# Patient Record
Sex: Male | Born: 1955 | Race: White | Hispanic: No | Marital: Married | Smoking: Former smoker
Health system: Southern US, Community
[De-identification: ages and names within clinical notes are randomized; demographics above are authoritative.]

## PROBLEM LIST (undated history)

## (undated) DIAGNOSIS — K219 Gastro-esophageal reflux disease without esophagitis: Secondary | ICD-10-CM

## (undated) DIAGNOSIS — I10 Essential (primary) hypertension: Secondary | ICD-10-CM

## (undated) DIAGNOSIS — G4700 Insomnia, unspecified: Secondary | ICD-10-CM

## (undated) HISTORY — DX: Insomnia, unspecified: G47.00

## (undated) HISTORY — DX: Gastro-esophageal reflux disease without esophagitis: K21.9

## (undated) HISTORY — DX: Essential (primary) hypertension: I10

---

## 2009-08-14 HISTORY — PX: ACHILLES TENDON REPAIR: SUR1153

## 2013-07-17 ENCOUNTER — Encounter (INDEPENDENT_AMBULATORY_CARE_PROVIDER_SITE_OTHER): Payer: Self-pay

## 2013-07-17 ENCOUNTER — Encounter: Payer: Self-pay | Admitting: Family Medicine

## 2013-07-17 ENCOUNTER — Ambulatory Visit (INDEPENDENT_AMBULATORY_CARE_PROVIDER_SITE_OTHER): Admitting: Family Medicine

## 2013-07-17 VITALS — BP 117/73 | HR 75 | Temp 99.2°F | Ht 66.0 in | Wt 205.0 lb

## 2013-07-17 DIAGNOSIS — N451 Epididymitis: Secondary | ICD-10-CM

## 2013-07-17 DIAGNOSIS — N453 Epididymo-orchitis: Secondary | ICD-10-CM

## 2013-07-17 DIAGNOSIS — N390 Urinary tract infection, site not specified: Secondary | ICD-10-CM

## 2013-07-17 DIAGNOSIS — R319 Hematuria, unspecified: Secondary | ICD-10-CM

## 2013-07-17 LAB — POCT UA - MICROSCOPIC ONLY
Casts, Ur, LPF, POC: NEGATIVE
Crystals, Ur, HPF, POC: NEGATIVE
Mucus, UA: NEGATIVE
Yeast, UA: NEGATIVE

## 2013-07-17 LAB — POCT URINALYSIS DIPSTICK

## 2013-07-17 MED ORDER — CIPROFLOXACIN HCL 500 MG PO TABS: 500.0000 mg | ORAL_TABLET | Freq: Two times a day (BID) | ORAL | Status: DC

## 2013-07-17 NOTE — Progress Notes (Signed)
   Subjective:    Patient ID: Devin Santiago, male    DOB: 08-21-1955, 57 y.o.   MRN: [id number]  HPI This 57 y.o. male presents for evaluation of dysuria, hematuria, and left testicular swelling And discomfort.  He has been having some urinary problems for over a few weeks now..   Review of Systems    No chest pain, SOB, HA, dizziness, vision change, N/V, diarrhea, constipation, dysuria, urinary urgency or frequency, myalgias, arthralgias or rash.  Objective:   Physical Exam Vital signs noted  Well developed well nourished male.  HEENT - Head atraumatic Normocephalic Respiratory - Lungs CTA bilateral Cardiac - RRR S1 and S2 without murmur GI - Abdomen soft Nontender and bowel sounds active x 4 GU - Swollen and tender left testicle and left inguinal lymph nodes.       Assessment & Plan:  Hematuria - Plan: POCT UA - Microscopic Only, POCT urinalysis dipstick, Urine culture  Epididymitis - Plan: Urine culture, cipro 500mg  one po bid x 3 weeks.  UTI (lower urinary tract infection) - Plan: Urine culture Cipro 500mg  one po bid x 3 weeks.  Recommend repeat UA one week after finishing abx's.  Push po fluids, rest, tylenol and motrin otc prn as directed for fever, arthralgias, and myalgias.  Follow up prn if sx's continue or persist.  Deatra Canter FNP

## 2013-07-17 NOTE — Patient Instructions (Signed)
Epididymitis  Epididymitis is a swelling (inflammation) of the epididymis. The epididymis is a cord-like structure along the back part of the testicle. Epididymitis is usually, but not always, caused by infection. This is usually a sudden problem beginning with chills, fever and pain behind the scrotum and in the testicle. There may be swelling and redness of the testicle.  DIAGNOSIS   Physical examination will reveal a tender, swollen epididymis. Sometimes, cultures are obtained from the urine or from prostate secretions to help find out if there is an infection or if the cause is a different problem. Sometimes, blood work is performed to see if your white blood cell count is elevated and if a germ (bacterial) or viral infection is present. Using this knowledge, an appropriate medicine which kills germs (antibiotic) can be chosen by your caregiver. A viral infection causing epididymitis will most often go away (resolve) without treatment.  HOME CARE INSTRUCTIONS   · Hot sitz baths for 20 minutes, 4 times per day, may help relieve pain.  · Only take over-the-counter or prescription medicines for pain, discomfort or fever as directed by your caregiver.  · Take all medicines, including antibiotics, as directed. Take the antibiotics for the full prescribed length of time even if you are feeling better.  · It is very important to keep all follow-up appointments.  SEEK IMMEDIATE MEDICAL CARE IF:   · You have a fever.  · You have pain not relieved with medicines.  · You have any worsening of your problems.  · Your pain seems to come and go.  · You develop pain, redness, and swelling in the scrotum and surrounding areas.  MAKE SURE YOU:   · Understand these instructions.  · Will watch your condition.  · Will get help right away if you are not doing well or get worse.  Document Released: 07/28/2000 Document Revised: 10/23/2011 Document Reviewed: 06/17/2009  ExitCare® Patient Information ©2014 [employer].

## 2013-07-19 LAB — URINE CULTURE

## 2013-08-12 ENCOUNTER — Ambulatory Visit (INDEPENDENT_AMBULATORY_CARE_PROVIDER_SITE_OTHER): Admitting: Family Medicine

## 2013-08-12 ENCOUNTER — Encounter: Payer: Self-pay | Admitting: Family Medicine

## 2013-08-12 VITALS — BP 145/86 | HR 68 | Temp 97.5°F | Ht 66.0 in | Wt 205.0 lb

## 2013-08-12 DIAGNOSIS — I10 Essential (primary) hypertension: Secondary | ICD-10-CM

## 2013-08-12 DIAGNOSIS — G4700 Insomnia, unspecified: Secondary | ICD-10-CM

## 2013-08-12 DIAGNOSIS — R319 Hematuria, unspecified: Secondary | ICD-10-CM

## 2013-08-12 DIAGNOSIS — N529 Male erectile dysfunction, unspecified: Secondary | ICD-10-CM

## 2013-08-12 DIAGNOSIS — K219 Gastro-esophageal reflux disease without esophagitis: Secondary | ICD-10-CM

## 2013-08-12 DIAGNOSIS — E291 Testicular hypofunction: Secondary | ICD-10-CM

## 2013-08-12 LAB — POCT URINALYSIS DIPSTICK
Bilirubin, UA: NEGATIVE
Blood, UA: NEGATIVE
Glucose, UA: NEGATIVE
Ketones, UA: NEGATIVE
Leukocytes, UA: NEGATIVE
Nitrite, UA: NEGATIVE
Protein, UA: NEGATIVE
Spec Grav, UA: 1.015
Urobilinogen, UA: NEGATIVE
pH, UA: 6.5

## 2013-08-12 LAB — POCT UA - MICROSCOPIC ONLY
Bacteria, U Microscopic: NEGATIVE
Casts, Ur, LPF, POC: NEGATIVE
Crystals, Ur, HPF, POC: NEGATIVE
Mucus, UA: NEGATIVE
RBC, urine, microscopic: NEGATIVE
WBC, Ur, HPF, POC: NEGATIVE
Yeast, UA: NEGATIVE

## 2013-08-12 MED ORDER — SILDENAFIL CITRATE 100 MG PO TABS: 50.0000 mg | ORAL_TABLET | Freq: Every day | ORAL | Status: DC | PRN

## 2013-08-12 MED ORDER — OMEPRAZOLE 40 MG PO CPDR: 40.0000 mg | DELAYED_RELEASE_CAPSULE | Freq: Every day | ORAL | Status: DC

## 2013-08-12 MED ORDER — LISINOPRIL 20 MG PO TABS: 20.0000 mg | ORAL_TABLET | Freq: Every day | ORAL | Status: DC

## 2013-08-12 MED ORDER — TRAZODONE HCL 50 MG PO TABS: 25.0000 mg | ORAL_TABLET | Freq: Every evening | ORAL | Status: DC | PRN

## 2013-08-12 MED ORDER — TESTOSTERONE 50 MG/5GM (1%) TD GEL: TRANSDERMAL | Status: DC

## 2013-08-12 NOTE — Patient Instructions (Signed)
Urinary Tract Infection  Urinary tract infections (UTIs) can develop anywhere along your urinary tract. Your urinary tract is your body's drainage system for removing wastes and extra water. Your urinary tract includes two kidneys, two ureters, a bladder, and a urethra. Your kidneys are a pair of bean-shaped organs. Each kidney is about the size of your fist. They are located below your ribs, one on each side of your spine.  CAUSES  Infections are caused by microbes, which are microscopic organisms, including fungi, viruses, and bacteria. These organisms are so small that they can only be seen through a microscope. Bacteria are the microbes that most commonly cause UTIs.  SYMPTOMS   Symptoms of UTIs may vary by age and gender of the patient and by the location of the infection. Symptoms in young women typically include a frequent and intense urge to urinate and a painful, burning feeling in the bladder or urethra during urination. Older women and men are more likely to be tired, shaky, and weak and have muscle aches and abdominal pain. A fever may mean the infection is in your kidneys. Other symptoms of a kidney infection include pain in your back or sides below the ribs, nausea, and vomiting.  DIAGNOSIS  To diagnose a UTI, your caregiver will ask you about your symptoms. Your caregiver also will ask to provide a urine sample. The urine sample will be tested for bacteria and white blood cells. White blood cells are made by your body to help fight infection.  TREATMENT   Typically, UTIs can be treated with medication. Because most UTIs are caused by a bacterial infection, they usually can be treated with the use of antibiotics. The choice of antibiotic and length of treatment depend on your symptoms and the type of bacteria causing your infection.  HOME CARE INSTRUCTIONS   If you were prescribed antibiotics, take them exactly as your caregiver instructs you. Finish the medication even if you feel better after you  have only taken some of the medication.   Drink enough water and fluids to keep your urine clear or pale yellow.   Avoid caffeine, tea, and carbonated beverages. They tend to irritate your bladder.   Empty your bladder often. Avoid holding urine for long periods of time.   Empty your bladder before and after sexual intercourse.   After a bowel movement, women should cleanse from front to back. Use each tissue only once.  SEEK MEDICAL CARE IF:    You have back pain.   You develop a fever.   Your symptoms do not begin to resolve within 3 days.  SEEK IMMEDIATE MEDICAL CARE IF:    You have severe back pain or lower abdominal pain.   You develop chills.   You have nausea or vomiting.   You have continued burning or discomfort with urination.  MAKE SURE YOU:    Understand these instructions.   Will watch your condition.   Will get help right away if you are not doing well or get worse.  Document Released: 05/10/2005 Document Revised: 01/30/2012 Document Reviewed: 09/08/2011  ExitCare Patient Information 2014 [employer].

## 2013-08-12 NOTE — Progress Notes (Signed)
   Subjective:    Patient ID: Devin Santiago, male    DOB: 07-01-1956, 57 y.o.   MRN: [id number]  HPI This 57 y.o. male presents for evaluation of follow up on left epididymitis and was tx'd with cipro and he Is feeling better.  He is no longer having any testicular discomfort.  He has hx of GERD, hypogonadism, Hypertension, and insomnia.  He needs refills.  He is seen at the [place] and gets labs done there and he Will bring labs to office from [place].   Review of Systems No chest pain, SOB, HA, dizziness, vision change, N/V, diarrhea, constipation, dysuria, urinary urgency or frequency, myalgias, arthralgias or rash.     Objective:   Physical Exam  Vital signs noted  Well developed well nourished male.  HEENT - Head atraumatic Normocephalic                Eyes - PERRLA, Conjuctiva - clear Sclera- Clear EOMI                Ears - EAC's Wnl TM's Wnl Gross Hearing WNL                Nose - Nares patent                 Throat - oropharanx wnl Respiratory - Lungs CTA bilateral Cardiac - RRR S1 and S2 without murmur GI - Abdomen soft Nontender and bowel sounds active x 4 GU- Uncircumcised penis, testicles w/o masses and nontender, no inguinal hernia Extremities - No edema. Neuro - Grossly intact.  Results for orders placed in visit on 08/12/13  POCT UA - MICROSCOPIC ONLY      Result Value Range   WBC, Ur, HPF, POC neg     RBC, urine, microscopic neg     Bacteria, U Microscopic neg     Mucus, UA neg     Epithelial cells, urine per micros occ     Crystals, Ur, HPF, POC neg     Casts, Ur, LPF, POC neg     Yeast, UA neg    POCT URINALYSIS DIPSTICK      Result Value Range   Color, UA yellow     Clarity, UA clear     Glucose, UA neg     Bilirubin, UA neg     Ketones, UA neg     Spec Grav, UA 1.015     Blood, UA neg     pH, UA 6.5     Protein, UA neg     Urobilinogen, UA negative     Nitrite, UA neg     Leukocytes, UA Negative        Assessment & Plan:  Hematuria - Plan: POCT UA  - Microscopic Only, POCT urinalysis dipstick  Resolved  Insomnia - Plan: traZODone (DESYREL) 50 MG tablet  GERD (gastroesophageal reflux disease) - Plan: omeprazole (PRILOSEC) 40 MG capsule  Hypogonadism male - Plan: testosterone (ANDROGEL) 50 MG/5GM GEL  ED (erectile dysfunction) - Plan: sildenafil (VIAGRA) 100 MG tablet  Essential hypertension, benign - Plan: lisinopril (PRINIVIL,ZESTRIL) 20 MG tablet  Epididymitis  - Resolved.  Follow up prn.  Follow up in 6 months  Deatra Canter FNP

## 2013-08-18 ENCOUNTER — Telehealth: Payer: Self-pay | Admitting: *Deleted

## 2013-08-18 NOTE — Telephone Encounter (Signed)
Ins. Will not cover [employer] androgel until he has had a diagnosis of hypogonadism as evidenced by two or more morning total testosterone levels below 300 ng/dl andneeds to have tried fortesta for a minimum of 90 days aand failed to achieve  Levels above 400.  I'm not sure where we go from here.

## 2013-08-19 ENCOUNTER — Other Ambulatory Visit: Payer: Self-pay | Admitting: Family Medicine

## 2013-08-19 MED ORDER — TESTOSTERONE 10 MG/ACT (2%) TD GEL: 10.0000 mg | TRANSDERMAL | Status: DC

## 2013-08-19 NOTE — Telephone Encounter (Signed)
Patient aware that prescription is available.

## 2013-08-19 NOTE — Telephone Encounter (Signed)
I have rx'd fortesta instead of androgel for his low testosterone.  If this is unable to be obtained then would go To injections monthly

## 2013-09-15 ENCOUNTER — Telehealth: Payer: Self-pay | Admitting: *Deleted

## 2013-09-15 NOTE — Telephone Encounter (Signed)
Ins co will not pay for any testosterone medication until the pt has had two levels below 280 she said it does not matter how close they were together but there must be two before it will cover anything and then fortesta is the preferred drug. Sorry I argued total stupidity on this but it didn't help at all.

## 2013-09-15 NOTE — Telephone Encounter (Signed)
The patient needs to follow up for another repeat testosterone level so his insurance will pay for testosterone tx

## 2013-12-03 NOTE — Telephone Encounter (Signed)
done

## 2014-07-03 ENCOUNTER — Other Ambulatory Visit: Payer: Self-pay | Admitting: Family Medicine

## 2014-07-03 NOTE — Telephone Encounter (Signed)
Last seen 07/2013

## 2014-07-04 ENCOUNTER — Other Ambulatory Visit: Payer: Self-pay | Admitting: Family Medicine

## 2014-09-16 ENCOUNTER — Other Ambulatory Visit: Payer: Self-pay | Admitting: Family Medicine

## 2015-02-20 ENCOUNTER — Other Ambulatory Visit: Payer: Self-pay | Admitting: Family Medicine

## 2015-03-03 ENCOUNTER — Ambulatory Visit (INDEPENDENT_AMBULATORY_CARE_PROVIDER_SITE_OTHER): Admitting: Family

## 2015-03-03 ENCOUNTER — Encounter: Payer: Self-pay | Admitting: Family

## 2015-03-03 VITALS — BP 127/83 | HR 64 | Temp 97.4°F | Ht 66.0 in | Wt 193.4 lb

## 2015-03-03 DIAGNOSIS — N529 Male erectile dysfunction, unspecified: Secondary | ICD-10-CM | POA: Diagnosis not present

## 2015-03-03 DIAGNOSIS — G4700 Insomnia, unspecified: Secondary | ICD-10-CM | POA: Diagnosis not present

## 2015-03-03 DIAGNOSIS — K219 Gastro-esophageal reflux disease without esophagitis: Secondary | ICD-10-CM | POA: Insufficient documentation

## 2015-03-03 DIAGNOSIS — I10 Essential (primary) hypertension: Secondary | ICD-10-CM | POA: Diagnosis not present

## 2015-03-03 DIAGNOSIS — E291 Testicular hypofunction: Secondary | ICD-10-CM

## 2015-03-03 DIAGNOSIS — Z13220 Encounter for screening for lipoid disorders: Secondary | ICD-10-CM

## 2015-03-03 DIAGNOSIS — E349 Endocrine disorder, unspecified: Secondary | ICD-10-CM

## 2015-03-03 MED ORDER — TESTOSTERONE 10 MG/ACT (2%) TD GEL
10.0000 mg | TRANSDERMAL | Status: DC
Start: 2015-03-03 — End: 2015-03-05

## 2015-03-03 MED ORDER — SILDENAFIL CITRATE 100 MG PO TABS: ORAL_TABLET | ORAL | Status: DC

## 2015-03-03 MED ORDER — OMEPRAZOLE 40 MG PO CPDR: DELAYED_RELEASE_CAPSULE | ORAL | Status: DC

## 2015-03-03 MED ORDER — LISINOPRIL 20 MG PO TABS: ORAL_TABLET | ORAL | Status: DC

## 2015-03-03 MED ORDER — TRAZODONE HCL 50 MG PO TABS: ORAL_TABLET | ORAL | Status: DC

## 2015-03-03 NOTE — Progress Notes (Signed)
Subjective:    Patient ID: Devin Santiago, male    DOB: 1956-03-29, 59 y.o.   MRN: [id number]  Pt presents to the office today for chronic follow up and lab work. [facility]   Hypertension This is a chronic problem. The current episode started more than 1 year ago. The problem has been resolved since onset. The problem is controlled. Pertinent negatives include no anxiety, headaches, palpitations, peripheral edema or shortness of breath. Risk factors for coronary artery disease include male gender and family history. Past treatments include ACE inhibitors. The current treatment provides significant improvement. There is no history of kidney disease, CAD/MI, CVA, heart failure or a thyroid problem. There is no history of sleep apnea.  Gastrophageal Reflux He reports no belching, no coughing or no heartburn. This is a chronic problem. The current episode started more than 1 year ago. The problem occurs rarely. The problem has been resolved. The symptoms are aggravated by lying down. He has tried a PPI for the symptoms. The treatment provided significant relief.      Review of Systems  Constitutional: Negative.   HENT: Negative.   Respiratory: Negative.  Negative for cough and shortness of breath.   Cardiovascular: Negative.  Negative for palpitations.  Gastrointestinal: Negative.  Negative for heartburn.  Endocrine: Negative.   Genitourinary: Negative.   Musculoskeletal: Negative.   Neurological: Negative.  Negative for headaches.  Hematological: Negative.   Psychiatric/Behavioral: Negative.   All other systems reviewed and are negative.      Objective:   Physical Exam  Constitutional: He is oriented to person, place, and time. He appears well-developed and well-nourished. No distress.  HENT:  Head: Normocephalic.  Right Ear: External ear normal.  Left Ear: External ear normal.  Nose: Nose normal.  Mouth/Throat: Oropharynx is clear and moist.  Eyes:  Pupils are equal, round, and reactive to light. Right eye exhibits no discharge. Left eye exhibits no discharge.  Neck: Normal range of motion. Neck supple. No thyromegaly present.  Cardiovascular: Normal rate, regular rhythm, normal heart sounds and intact distal pulses.   No murmur heard. Pulmonary/Chest: Effort normal and breath sounds normal. No respiratory distress. He has no wheezes.  Abdominal: Soft. Bowel sounds are normal. He exhibits no distension. There is no tenderness.  Musculoskeletal: Normal range of motion. He exhibits no edema or tenderness.  Neurological: He is alert and oriented to person, place, and time. He has normal reflexes. No cranial nerve deficit.  Skin: Skin is warm and dry. No rash noted. No erythema.  Psychiatric: He has a normal mood and affect. His behavior is normal. Judgment and thought content normal.  Vitals reviewed.   BP 127/83 mmHg  Pulse 64  Temp(Src) 97.4 F (36.3 C) (Oral)  Ht 5' 6"  (1.676 m)  Wt 193 lb 6.4 oz (87.726 kg)  BMI 31.23 kg/m2       Assessment & Plan:  1. Essential hypertension - CMP14+EGFR - lisinopril (PRINIVIL,ZESTRIL) 20 MG tablet; TAKE 1 TABLET (20 MG TOTAL) DAILY  Dispense: 90 tablet; Refill: 3  2. Gastroesophageal reflux disease, esophagitis presence not specified - CMP14+EGFR - omeprazole (PRILOSEC) 40 MG capsule; TAKE 1 CAPSULE (40 MG TOTAL) DAILY  Dispense: 90 capsule; Refill: 3  3. Erectile dysfunction, unspecified erectile dysfunction type - CMP14+EGFR - sildenafil (VIAGRA) 100 MG tablet; TAKE ONE-HALF (1/2) TO ONE TABLET (50 TO 100 MG TOTAL) DAILY AS NEEDED FOR ERECTILE DYSFUNCTION  Dispense: 12 tablet; Refill: 6  4. Testosterone  deficiency - CMP14+EGFR - Testosterone,Free and Total - Testosterone (FORTESTA) 10 MG/ACT (2%) GEL; Place 10 mg onto the skin 1 day or 1 dose.  Dispense: 60 g; Refill: 3  5. Insomnia - CMP14+EGFR - traZODone (DESYREL) 50 MG tablet; TAKE ONE-HALF (1/2) TO ONE TABLET (25 TO 50 MG  TOTAL) AT BEDTIME AS NEEDED FOR SLEEP  Dispense: 90 tablet; Refill: 3  6. Screening cholesterol level - CMP14+EGFR - Lipid panel   Continue all meds Labs pending Health Maintenance reviewed Diet and exercise encouraged RTO 1 year  Evelina Dun, FNP

## 2015-03-03 NOTE — Patient Instructions (Signed)

## 2015-03-04 LAB — LIPID PANEL
Chol/HDL Ratio: 5.4 ratio units — ABNORMAL HIGH (ref 0.0–5.0)
Cholesterol, Total: 135 mg/dL (ref 100–199)
HDL: 25 mg/dL — AB (ref >39)
LDL CALC: 80 mg/dL (ref 0–99)
TRIGLYCERIDES: 152 mg/dL — AB (ref 0–149)
VLDL Cholesterol Cal: 30 mg/dL (ref 5–40)

## 2015-03-04 LAB — CMP14+EGFR
A/G RATIO: 1.6 (ref 1.1–2.5)
ALBUMIN: 4.3 g/dL (ref 3.5–5.5)
ALT: 18 IU/L (ref 0–44)
AST: 21 IU/L (ref 0–40)
Alkaline Phosphatase: 70 IU/L (ref 39–117)
BILIRUBIN TOTAL: 0.4 mg/dL (ref 0.0–1.2)
BUN/Creatinine Ratio: 18 (ref 9–20)
BUN: 21 mg/dL (ref 6–24)
CALCIUM: 9.8 mg/dL (ref 8.7–10.2)
CO2: 23 mmol/L (ref 18–29)
CREATININE: 1.2 mg/dL (ref 0.76–1.27)
Chloride: 101 mmol/L (ref 97–108)
GFR, EST AFRICAN AMERICAN: 77 mL/min/{1.73_m2} (ref >59)
GFR, EST NON AFRICAN AMERICAN: 67 mL/min/{1.73_m2} (ref >59)
Globulin, Total: 2.7 g/dL (ref 1.5–4.5)
Glucose: 88 mg/dL (ref 65–99)
Potassium: 4.2 mmol/L (ref 3.5–5.2)
SODIUM: 140 mmol/L (ref 134–144)
Total Protein: 7 g/dL (ref 6.0–8.5)

## 2015-03-04 LAB — TESTOSTERONE,FREE AND TOTAL
Testosterone, Free: 5.3 pg/mL — ABNORMAL LOW (ref 7.2–24.0)
Testosterone: 288 ng/dL — ABNORMAL LOW (ref 348–1197)

## 2015-03-05 ENCOUNTER — Other Ambulatory Visit: Payer: Self-pay | Admitting: Family

## 2015-03-05 DIAGNOSIS — E349 Endocrine disorder, unspecified: Secondary | ICD-10-CM

## 2015-03-05 MED ORDER — TESTOSTERONE 10 MG/ACT (2%) TD GEL: 20.0000 mg | TRANSDERMAL | Status: DC

## 2015-03-16 ENCOUNTER — Telehealth: Payer: Self-pay

## 2015-03-16 NOTE — Telephone Encounter (Signed)
Insurance approved prior authorization for Testosterone 2% gel

## 2015-03-17 NOTE — Telephone Encounter (Signed)
x

## 2015-10-26 ENCOUNTER — Encounter: Payer: Self-pay | Admitting: Family

## 2015-11-01 ENCOUNTER — Other Ambulatory Visit: Payer: Self-pay | Admitting: Family

## 2015-11-01 NOTE — Telephone Encounter (Signed)
Last seen 03/03/15 Devin Santiago  PCP  If approved route to nurse to call into [facility]

## 2015-11-01 NOTE — Telephone Encounter (Signed)
rx called into pharmacy

## 2015-11-15 ENCOUNTER — Ambulatory Visit (INDEPENDENT_AMBULATORY_CARE_PROVIDER_SITE_OTHER): Admitting: Family Medicine

## 2015-11-15 ENCOUNTER — Encounter: Payer: Self-pay | Admitting: Family Medicine

## 2015-11-15 VITALS — BP 135/87 | HR 62 | Temp 98.0°F | Ht 66.0 in | Wt 208.2 lb

## 2015-11-15 DIAGNOSIS — G4700 Insomnia, unspecified: Secondary | ICD-10-CM | POA: Diagnosis not present

## 2015-11-15 DIAGNOSIS — D171 Benign lipomatous neoplasm of skin and subcutaneous tissue of trunk: Secondary | ICD-10-CM

## 2015-11-15 DIAGNOSIS — K219 Gastro-esophageal reflux disease without esophagitis: Secondary | ICD-10-CM

## 2015-11-15 DIAGNOSIS — I10 Essential (primary) hypertension: Secondary | ICD-10-CM | POA: Diagnosis not present

## 2015-11-15 DIAGNOSIS — N529 Male erectile dysfunction, unspecified: Secondary | ICD-10-CM

## 2015-11-15 MED ORDER — TRAZODONE HCL 50 MG PO TABS: ORAL_TABLET | ORAL | Status: DC

## 2015-11-15 MED ORDER — OMEPRAZOLE 40 MG PO CPDR: DELAYED_RELEASE_CAPSULE | ORAL | Status: DC

## 2015-11-15 MED ORDER — LISINOPRIL 40 MG PO TABS: 40.0000 mg | ORAL_TABLET | Freq: Every day | ORAL | Status: DC

## 2015-11-15 MED ORDER — SILDENAFIL CITRATE 100 MG PO TABS: ORAL_TABLET | ORAL | Status: DC

## 2015-11-15 MED ORDER — TESTOSTERONE 10 MG/ACT (2%) TD GEL: TRANSDERMAL | Status: DC

## 2015-11-15 NOTE — Progress Notes (Signed)
BP 135/87 mmHg  Pulse 62  Temp(Src) 98 F (36.7 C) (Oral)  Ht 5\' 6"  (1.676 m)  Wt 208 lb 3.2 oz (94.439 kg)  BMI 33.62 kg/m2   Subjective:    Patient ID: Devin Santiago, male    DOB: 1956/08/03, 60 y.o.   MRN: [id number]  HPI: Devin Santiago is a 60 y.o. male presenting on 11/15/2015 for Discuss labwork; Knot on right side; and Medication refills   HPI Hypertension recheck Patient comes in for a recheck for his blood pressure. His blood pressure today is 135/87. He is currently taking lisinopril 40 mg. He denies any issues with the medication. He did just have his labs rechecked through the [place] and we have a printed version of that is scanned into our system. The only thing off major from those labs out of everything is at the kidney function is slightly more elevated than it was previously. His kidney function is 1.48. Patient denies headaches, blurred vision, chest pains, shortness of breath, or weakness. Denies any side effects from medication and is content with current medication.   GERD Patient has been on omeprazole and is doing well the dose. He denies any heartburn or reflux issues currently.  Erectile dysfunction. Patient is coming in with issues of erectile dysfunction which happens intermittently. He also has issues getting an erection but once he gets that he is able to keep it. He has been using Viagra intermittently 50 mg but not every time it has been helping for those times when he can't obtain an erection. He denies medication.  Abdominal wall lump Patient has libido will remove his right side that is just under the skin and he is able to move it and feel it is hard. It is also nontender. It is been there for quite a few years unchanged and he just wanted to double check with me that we think it's the same as what previous providers have thought.  Insomnia Patient has had insomnia for quite some time and he has been on trazodone and it has been working well for his insomnia.  He doesn't take it every night but On the nights that he needs it.  Relevant past medical, surgical, family and social history reviewed and updated as indicated. Interim medical history since our last visit reviewed. Allergies and medications reviewed and updated.  Review of Systems  Constitutional: Negative for fever and chills.  HENT: Negative for ear discharge and ear pain.   Eyes: Negative for discharge and visual disturbance.  Respiratory: Negative for shortness of breath and wheezing.   Cardiovascular: Negative for chest pain and leg swelling.  Gastrointestinal: Negative for nausea, vomiting, abdominal pain, diarrhea, constipation and blood in stool.  Genitourinary: Negative for difficulty urinating.  Musculoskeletal: Negative for back pain and gait problem.  Skin: Negative for color change and rash.  Neurological: Negative for syncope, light-headedness and headaches.  Psychiatric/Behavioral: Positive for sleep disturbance. Negative for suicidal ideas, self-injury, dysphoric mood and agitation. The patient is not nervous/anxious.   All other systems reviewed and are negative.   Per HPI unless specifically indicated above     Medication List       This list is accurate as of: 11/15/15  3:32 PM.  Always use your most recent med list.               aspirin 81 MG tablet  Take 81 mg by mouth daily.     lisinopril 40 MG tablet  Commonly known as:  PRINIVIL,ZESTRIL  Take 1 tablet (40 mg total) by mouth daily.     omeprazole 40 MG capsule  Commonly known as:  PRILOSEC  TAKE 1 CAPSULE (40 MG TOTAL) DAILY     sildenafil 100 MG tablet  Commonly known as:  VIAGRA  TAKE ONE-HALF (1/2) TO ONE TABLET (50 TO 100 MG TOTAL) DAILY AS NEEDED FOR ERECTILE DYSFUNCTION     Testosterone 10 MG/ACT (2%) Gel  PLACE 20 MG ONTO THE SKIN ONCE DAILY     traZODone 50 MG tablet  Commonly known as:  DESYREL  TAKE ONE-HALF (1/2) TO ONE TABLET (25 TO 50 MG TOTAL) AT BEDTIME AS NEEDED FOR SLEEP             Objective:    BP 135/87 mmHg  Pulse 62  Temp(Src) 98 F (36.7 C) (Oral)  Ht 5\' 6"  (1.676 m)  Wt 208 lb 3.2 oz (94.439 kg)  BMI 33.62 kg/m2  Wt Readings from Last 3 Encounters:  11/15/15 208 lb 3.2 oz (94.439 kg)  03/03/15 193 lb 6.4 oz (87.726 kg)  08/12/13 205 lb (92.987 kg)    Physical Exam  Constitutional: He is oriented to person, place, and time. He appears well-developed and well-nourished. No distress.  Eyes: Conjunctivae and EOM are normal. Pupils are equal, round, and reactive to light. Right eye exhibits no discharge. No scleral icterus.  Neck: Neck supple. No thyromegaly present.  Cardiovascular: Normal rate, regular rhythm, normal heart sounds and intact distal pulses.   No murmur heard. Pulmonary/Chest: Effort normal and breath sounds normal. No respiratory distress. He has no wheezes.  Abdominal: Soft. Bowel sounds are normal. He exhibits no distension. There is no tenderness. There is no rebound.  Musculoskeletal: Normal range of motion. He exhibits no edema.  Lymphadenopathy:    He has no cervical adenopathy.  Neurological: He is alert and oriented to person, place, and time. No cranial nerve deficit. Coordination normal.  Skin: Skin is warm and dry. Lesion (Small half centimeter nodule and right anterior abdominal wall, firm mobile lesion that would be consistent with a lipoma.) noted. No rash noted. He is not diaphoretic.  Psychiatric: He has a normal mood and affect. His behavior is normal.  Nursing note and vitals reviewed.       Assessment & Plan:   Problem List Items Addressed This Visit      Cardiovascular and Mediastinum   Essential hypertension - Primary   Relevant Medications   sildenafil (VIAGRA) 100 MG tablet   lisinopril (PRINIVIL,ZESTRIL) 40 MG tablet   Other Relevant Orders   Exercise Tolerance Test     Digestive   GERD (gastroesophageal reflux disease)   Relevant Medications   omeprazole (PRILOSEC) 40 MG capsule      Genitourinary   Erectile dysfunction   Relevant Medications   sildenafil (VIAGRA) 100 MG tablet     Other   Insomnia   Relevant Medications   traZODone (DESYREL) 50 MG tablet    Other Visit Diagnoses    Lipoma of abdominal wall            Follow up plan: Return in about 6 months (around 05/16/2016), or if symptoms worsen or fail to improve, for Hypertension recheck.  Counseling provided for all of the vaccine components No orders of the defined types were placed in this encounter.    Caryl Pina, MD [facility] 11/15/2015, 3:32 PM

## 2015-12-16 ENCOUNTER — Encounter (INDEPENDENT_AMBULATORY_CARE_PROVIDER_SITE_OTHER)

## 2015-12-16 ENCOUNTER — Other Ambulatory Visit: Payer: Self-pay | Admitting: *Deleted

## 2015-12-16 DIAGNOSIS — I10 Essential (primary) hypertension: Secondary | ICD-10-CM

## 2015-12-16 LAB — EXERCISE TOLERANCE TEST
CHL CUP MPHR: 162 {beats}/min
CHL CUP RESTING HR STRESS: 63 {beats}/min
CSEPEDS: 48 s
CSEPHR: 87 %
Estimated workload: 10.5 METS
Exercise duration (min): 9 min
Peak HR: 142 {beats}/min
RPE: 7

## 2016-03-01 ENCOUNTER — Other Ambulatory Visit: Payer: Self-pay

## 2016-03-01 NOTE — Telephone Encounter (Signed)
Last seen Dr Warrick Parisian  Also has seen [facility]   Needs Testosterone printed for Mail order

## 2016-03-02 MED ORDER — TESTOSTERONE 10 MG/ACT (2%) TD GEL: TRANSDERMAL | Status: DC

## 2016-03-02 NOTE — Telephone Encounter (Signed)
RX ready for pick up 

## 2016-03-02 NOTE — Telephone Encounter (Signed)
Left detailed message that rx is ready to pick up and to call back with any further questions and concerns.

## 2016-03-13 ENCOUNTER — Encounter: Payer: Self-pay | Admitting: Family Medicine

## 2016-03-13 ENCOUNTER — Ambulatory Visit (INDEPENDENT_AMBULATORY_CARE_PROVIDER_SITE_OTHER): Admitting: Family Medicine

## 2016-03-13 VITALS — BP 136/80 | HR 72 | Temp 97.7°F | Ht 66.0 in | Wt 204.8 lb

## 2016-03-13 DIAGNOSIS — I10 Essential (primary) hypertension: Secondary | ICD-10-CM | POA: Diagnosis not present

## 2016-03-13 DIAGNOSIS — K219 Gastro-esophageal reflux disease without esophagitis: Secondary | ICD-10-CM

## 2016-03-13 DIAGNOSIS — R197 Diarrhea, unspecified: Secondary | ICD-10-CM | POA: Diagnosis not present

## 2016-03-13 MED ORDER — CIPROFLOXACIN HCL 500 MG PO TABS: 500.0000 mg | ORAL_TABLET | Freq: Two times a day (BID) | ORAL | 0 refills | Status: DC

## 2016-03-13 NOTE — Progress Notes (Signed)
Subjective:  Patient ID: Devin Santiago, male    DOB: 09-12-1955  Age: 60 y.o. MRN: [id number]  CC: GI upset (diarrhea started on Sunday, recent travel to [place])   HPI Devin Santiago presents for Diarrhea starting yesterday. Patient reports that he just returned from the [place] 2 days ago. He did have some exposure to water in the form of ice. There may been some other in the form of cold meats. He has some leftover Cipro from previous trips 3 years ago. He took 2 yesterday and 1 so far today. The diarrhea has remitted somewhat. He started out yesterday morning with 4 back-to-back loose watery bowel movements. Throughout the day he made multiple trips to the restroom for loose bowel movements. It seems to have backed off today but there have been 6-8 so far by 5 PM. There is minimal abdominal pain. No tenesmus. No hematochezia and no melena. History Devin Santiago has a past medical history of GERD (gastroesophageal reflux disease); Hypertension; and Insomnia.   He has no past surgical history on file.   His family history includes Diabetes in his mother; Heart disease in his mother.He reports that he quit smoking about 17 years ago. His smoking use included Cigarettes. He does not have any smokeless tobacco history on file. He reports that he drinks alcohol. He reports that he does not use drugs.    ROS Review of Systems  Constitutional: Negative for chills, diaphoresis, fever and unexpected weight change.  HENT: Negative for rhinorrhea and trouble swallowing.   Respiratory: Negative for cough, chest tightness and shortness of breath.   Cardiovascular: Negative for chest pain.  Gastrointestinal: Positive for abdominal pain and diarrhea. Negative for abdominal distention, blood in stool, constipation, nausea, rectal pain and vomiting.  Genitourinary: Negative for dysuria, flank pain and hematuria.  Musculoskeletal: Negative for arthralgias and joint swelling.  Skin: Negative for rash.    Neurological: Negative for syncope and headaches.    Objective:  BP 136/80 (BP Location: Left Arm, Patient Position: Sitting, Cuff Size: Large)   Pulse 72   Temp 97.7 F (36.5 C) (Oral)   Ht 5\' 6"  (1.676 m)   Wt 204 lb 12.8 oz (92.9 kg)   SpO2 98%   BMI 33.06 kg/m   BP Readings from Last 3 Encounters:  03/13/16 136/80  11/15/15 135/87  03/03/15 127/83    Wt Readings from Last 3 Encounters:  03/13/16 204 lb 12.8 oz (92.9 kg)  11/15/15 208 lb 3.2 oz (94.4 kg)  03/03/15 193 lb 6.4 oz (87.7 kg)     Physical Exam  Constitutional: He is oriented to person, place, and time. He appears well-developed and well-nourished. No distress.  HENT:  Head: Normocephalic and atraumatic.  Right Ear: External ear normal.  Left Ear: External ear normal.  Nose: Nose normal.  Mouth/Throat: Oropharynx is clear and moist.  Eyes: Conjunctivae and EOM are normal. Pupils are equal, round, and reactive to light.  Neck: Normal range of motion. Neck supple. No thyromegaly present.  Cardiovascular: Normal rate, regular rhythm and normal heart sounds.   No murmur heard. Pulmonary/Chest: Effort normal and breath sounds normal. No respiratory distress. He has no wheezes. He has no rales.  Abdominal: Soft. Bowel sounds are normal. He exhibits no distension and no mass. There is no tenderness.  Lymphadenopathy:    He has no cervical adenopathy.  Neurological: He is alert and oriented to person, place, and time. He has normal reflexes.  Skin: Skin is warm and dry.  Psychiatric: He has a normal mood and affect. His behavior is normal. Judgment and thought content normal.     Lab Results  Component Value Date   GLUCOSE 88 03/03/2015   CHOL 135 03/03/2015   TRIG 152 (H) 03/03/2015   HDL 25 (L) 03/03/2015   LDLCALC 80 03/03/2015   ALT 18 03/03/2015   AST 21 03/03/2015   NA 140 03/03/2015   K 4.2 03/03/2015   CL 101 03/03/2015   CREATININE 1.20 03/03/2015   BUN 21 03/03/2015   CO2 23 03/03/2015     Patient was never admitted.  Assessment & Plan:   Devin Santiago was seen today for gi upset.  Diagnoses and all orders for this visit:  Diarrhea, unspecified type  Essential hypertension  Gastroesophageal reflux disease, esophagitis presence not specified  Other orders -     Amylase -     Hepatic function panel -     Lipase -     DG Abd 2 Views; Future   I suspect this diarrhea to be secondary to his travel. Since he is already started Cipro 500 twice a day for 3 doses, stool culture results may be skewed. Therefore I'm going to just go ahead with a full course of Cipro. If symptoms do not resolve will do further workup as listed above at that time at that time.  I have discontinued Devin Santiago's aspirin. I am also having him maintain his traZODone, sildenafil, omeprazole, lisinopril, and Testosterone.   Follow-up: as needed of continuing, or worsening symptoms and for routine follow up of chronic diagnoses Devin Santiago, M.D.

## 2016-05-12 ENCOUNTER — Encounter: Payer: Self-pay | Admitting: Family Medicine

## 2016-05-12 ENCOUNTER — Ambulatory Visit (INDEPENDENT_AMBULATORY_CARE_PROVIDER_SITE_OTHER): Admitting: Family Medicine

## 2016-05-12 VITALS — BP 134/87 | HR 71 | Temp 98.0°F | Ht 66.0 in | Wt 205.0 lb

## 2016-05-12 DIAGNOSIS — E349 Endocrine disorder, unspecified: Secondary | ICD-10-CM

## 2016-05-12 DIAGNOSIS — D492 Neoplasm of unspecified behavior of bone, soft tissue, and skin: Secondary | ICD-10-CM | POA: Diagnosis not present

## 2016-05-12 DIAGNOSIS — E291 Testicular hypofunction: Secondary | ICD-10-CM

## 2016-05-12 DIAGNOSIS — I10 Essential (primary) hypertension: Secondary | ICD-10-CM

## 2016-05-12 DIAGNOSIS — R7989 Other specified abnormal findings of blood chemistry: Secondary | ICD-10-CM

## 2016-05-12 NOTE — Progress Notes (Signed)
BP 134/87   Pulse 71   Temp 98 F (36.7 C) (Oral)   Ht 5' 6"  (1.676 m)   Wt 205 lb (93 kg)   BMI 33.09 kg/m    Subjective:    Patient ID: Devin Santiago, male    DOB: 1955/12/14, 60 y.o.   MRN: [id number]  HPI: Devin Santiago is a 60 y.o. male presenting on 05/12/2016 for Knots on body (back, abdomen and legs) and Labwork (patient is fasting)   HPI Knots on body Patient has an abdominal wall right lower quadrant not that was present the last time he saw some few months ago and now is developed a new one in his right flank region. He is mostly concerned because he has had a few family members that have had cancer recently, his father was diagnosed with lung cancer and his brother was diagnosed with renal cancer. He wants to get these checked out. He denies any tenderness or overlying erythema or warmth or drainage.  Hypertension recheck and testosterone recheck Patient is coming in for hypertension recheck and testosterone recheck. Will get labs done today. His hypertension is controlled and his blood pressure today is 134/77.  Relevant past medical, surgical, family and social history reviewed and updated as indicated. Interim medical history since our last visit reviewed. Allergies and medications reviewed and updated.  Review of Systems  Constitutional: Negative for chills and fever.  Eyes: Negative for discharge.  Respiratory: Negative for shortness of breath and wheezing.   Cardiovascular: Negative for chest pain and leg swelling.  Musculoskeletal: Negative for back pain and gait problem.  Skin: Negative for color change, rash and wound.  All other systems reviewed and are negative.   Per HPI unless specifically indicated above     Medication List       Accurate as of 05/12/16  9:33 AM. Always use your most recent med list.          lisinopril 40 MG tablet Commonly known as:  PRINIVIL,ZESTRIL Take 1 tablet (40 mg total) by mouth daily.   omeprazole 40 MG  capsule Commonly known as:  PRILOSEC TAKE 1 CAPSULE (40 MG TOTAL) DAILY   sildenafil 100 MG tablet Commonly known as:  VIAGRA TAKE ONE-HALF (1/2) TO ONE TABLET (50 TO 100 MG TOTAL) DAILY AS NEEDED FOR ERECTILE DYSFUNCTION   Testosterone 10 MG/ACT (2%) Gel PLACE 20 MG ONTO THE SKIN ONCE DAILY   traZODone 50 MG tablet Commonly known as:  DESYREL TAKE ONE-HALF (1/2) TO ONE TABLET (25 TO 50 MG TOTAL) AT BEDTIME AS NEEDED FOR SLEEP          Objective:    BP 134/87   Pulse 71   Temp 98 F (36.7 C) (Oral)   Ht 5' 6"  (1.676 m)   Wt 205 lb (93 kg)   BMI 33.09 kg/m   Wt Readings from Last 3 Encounters:  05/12/16 205 lb (93 kg)  03/13/16 204 lb 12.8 oz (92.9 kg)  11/15/15 208 lb 3.2 oz (94.4 kg)    Physical Exam  Constitutional: He is oriented to person, place, and time. He appears well-developed and well-nourished. No distress.  Eyes: Conjunctivae are normal. Right eye exhibits no discharge. Left eye exhibits no discharge. No scleral icterus.  Cardiovascular: Normal rate, regular rhythm, normal heart sounds and intact distal pulses.   No murmur heard. Pulmonary/Chest: Effort normal and breath sounds normal. No respiratory distress. He has no wheezes.  Musculoskeletal: Normal range of motion. He exhibits no  edema.  Neurological: He is alert and oriented to person, place, and time. Coordination normal.  Skin: Skin is warm and dry. Lesion (small abdominal wall RLQ nodule, and right flanks small nodule.  Mobile and nontender) noted. No rash noted. He is not diaphoretic.  Psychiatric: He has a normal mood and affect. His behavior is normal.  Nursing note and vitals reviewed.     Assessment & Plan:   Problem List Items Addressed This Visit      Cardiovascular and Mediastinum   Essential hypertension   Relevant Orders   CBC with Differential/Platelet   CMP14+EGFR   Lipid panel     Other   Testosterone deficiency - Primary   Relevant Orders   Testosterone,Free and Total     Other Visit Diagnoses    Soft tissue tumor       Relevant Orders   US Abdomen Limited   US Misc Soft Tissue       Follow up plan: Return if symptoms worsen or fail to improve.  Counseling provided for all of the vaccine components Orders Placed This Encounter  Procedures  . CBC with Differential/Platelet  . CMP14+EGFR  . Lipid panel  . Testosterone,Free and Total    Caryl Pina, MD [facility] 05/12/2016, 9:33 AM

## 2016-05-14 LAB — CBC WITH DIFFERENTIAL/PLATELET
BASOS ABS: 0 10*3/uL (ref 0.0–0.2)
Basos: 1 %
EOS (ABSOLUTE): 0.2 10*3/uL (ref 0.0–0.4)
Eos: 2 %
Hematocrit: 51.2 % — ABNORMAL HIGH (ref 37.5–51.0)
Hemoglobin: 17.7 g/dL (ref 12.6–17.7)
IMMATURE GRANS (ABS): 0 10*3/uL (ref 0.0–0.1)
Immature Granulocytes: 0 %
LYMPHS: 23 %
Lymphocytes Absolute: 1.8 10*3/uL (ref 0.7–3.1)
MCH: 29.5 pg (ref 26.6–33.0)
MCHC: 34.6 g/dL (ref 31.5–35.7)
MCV: 85 fL (ref 79–97)
Monocytes Absolute: 0.6 10*3/uL (ref 0.1–0.9)
Monocytes: 8 %
NEUTROS ABS: 5.1 10*3/uL (ref 1.4–7.0)
Neutrophils: 66 %
PLATELETS: 203 10*3/uL (ref 150–379)
RBC: 6.01 x10E6/uL — AB (ref 4.14–5.80)
RDW: 12.6 % (ref 12.3–15.4)
WBC: 7.8 10*3/uL (ref 3.4–10.8)

## 2016-05-14 LAB — CMP14+EGFR
A/G RATIO: 1.5 (ref 1.2–2.2)
ALT: 34 IU/L (ref 0–44)
AST: 33 IU/L (ref 0–40)
Albumin: 4.4 g/dL (ref 3.5–5.5)
Alkaline Phosphatase: 64 IU/L (ref 39–117)
BUN/Creatinine Ratio: 12 (ref 9–20)
BUN: 19 mg/dL (ref 6–24)
Bilirubin Total: 0.7 mg/dL (ref 0.0–1.2)
CALCIUM: 9.6 mg/dL (ref 8.7–10.2)
CO2: 22 mmol/L (ref 18–29)
CREATININE: 1.58 mg/dL — AB (ref 0.76–1.27)
Chloride: 101 mmol/L (ref 96–106)
GFR, EST AFRICAN AMERICAN: 55 mL/min/{1.73_m2} — AB (ref >59)
GFR, EST NON AFRICAN AMERICAN: 48 mL/min/{1.73_m2} — AB (ref >59)
GLUCOSE: 109 mg/dL — AB (ref 65–99)
Globulin, Total: 2.9 g/dL (ref 1.5–4.5)
POTASSIUM: 4.7 mmol/L (ref 3.5–5.2)
Sodium: 140 mmol/L (ref 134–144)
TOTAL PROTEIN: 7.3 g/dL (ref 6.0–8.5)

## 2016-05-14 LAB — LIPID PANEL
CHOL/HDL RATIO: 4.9 ratio (ref 0.0–5.0)
Cholesterol, Total: 128 mg/dL (ref 100–199)
HDL: 26 mg/dL — ABNORMAL LOW (ref >39)
LDL Calculated: 88 mg/dL (ref 0–99)
TRIGLYCERIDES: 71 mg/dL (ref 0–149)
VLDL CHOLESTEROL CAL: 14 mg/dL (ref 5–40)

## 2016-05-14 LAB — TESTOSTERONE,FREE AND TOTAL
TESTOSTERONE: 613 ng/dL (ref 264–916)
Testosterone, Free: 10.8 pg/mL (ref 7.2–24.0)

## 2016-05-15 NOTE — Addendum Note (Signed)
Addended by: Thana Ates on: 05/15/2016 10:22 AM   Modules accepted: Orders

## 2016-05-19 ENCOUNTER — Ambulatory Visit (HOSPITAL_COMMUNITY)
Admission: RE | Admit: 2016-05-19 | Discharge: 2016-05-19 | Disposition: A | Discharge status: Home / Self Care | Source: Ambulatory Visit | Attending: Family Medicine | Admitting: Family Medicine

## 2016-05-19 DIAGNOSIS — D492 Neoplasm of unspecified behavior of bone, soft tissue, and skin: Secondary | ICD-10-CM | POA: Diagnosis present

## 2016-07-31 ENCOUNTER — Other Ambulatory Visit

## 2016-07-31 DIAGNOSIS — E349 Endocrine disorder, unspecified: Secondary | ICD-10-CM

## 2016-07-31 DIAGNOSIS — I10 Essential (primary) hypertension: Secondary | ICD-10-CM

## 2016-07-31 DIAGNOSIS — R7989 Other specified abnormal findings of blood chemistry: Secondary | ICD-10-CM

## 2016-08-01 LAB — LIPID PANEL
CHOL/HDL RATIO: 5 ratio (ref 0.0–5.0)
Cholesterol, Total: 125 mg/dL (ref 100–199)
HDL: 25 mg/dL — AB (ref >39)
LDL CALC: 73 mg/dL (ref 0–99)
Triglycerides: 137 mg/dL (ref 0–149)
VLDL CHOLESTEROL CAL: 27 mg/dL (ref 5–40)

## 2016-08-01 LAB — TESTOSTERONE,FREE AND TOTAL
TESTOSTERONE: 775 ng/dL (ref 264–916)
Testosterone, Free: 21 pg/mL (ref 7.2–24.0)

## 2016-08-01 LAB — BMP8+EGFR
BUN / CREAT RATIO: 16 (ref 9–20)
BUN: 19 mg/dL (ref 6–24)
CALCIUM: 8.7 mg/dL (ref 8.7–10.2)
CO2: 25 mmol/L (ref 18–29)
Chloride: 103 mmol/L (ref 96–106)
Creatinine, Ser: 1.22 mg/dL (ref 0.76–1.27)
GFR, EST AFRICAN AMERICAN: 75 mL/min/{1.73_m2} (ref >59)
GFR, EST NON AFRICAN AMERICAN: 64 mL/min/{1.73_m2} (ref >59)
Glucose: 102 mg/dL — ABNORMAL HIGH (ref 65–99)
POTASSIUM: 4.5 mmol/L (ref 3.5–5.2)
Sodium: 142 mmol/L (ref 134–144)

## 2016-08-02 ENCOUNTER — Telehealth: Payer: Self-pay | Admitting: Family Medicine

## 2016-08-02 NOTE — Telephone Encounter (Signed)
Patient aware of lab results.

## 2016-10-05 ENCOUNTER — Other Ambulatory Visit: Payer: Self-pay

## 2016-10-05 ENCOUNTER — Telehealth: Payer: Self-pay | Admitting: Family Medicine

## 2016-10-05 DIAGNOSIS — K219 Gastro-esophageal reflux disease without esophagitis: Secondary | ICD-10-CM

## 2016-10-05 DIAGNOSIS — G4700 Insomnia, unspecified: Secondary | ICD-10-CM

## 2016-10-05 DIAGNOSIS — N529 Male erectile dysfunction, unspecified: Secondary | ICD-10-CM

## 2016-10-05 MED ORDER — SILDENAFIL CITRATE 100 MG PO TABS: ORAL_TABLET | ORAL | 0 refills | Status: DC

## 2016-10-05 MED ORDER — TRAZODONE HCL 50 MG PO TABS: ORAL_TABLET | ORAL | 0 refills | Status: DC

## 2016-10-05 MED ORDER — OMEPRAZOLE 40 MG PO CPDR: DELAYED_RELEASE_CAPSULE | ORAL | 0 refills | Status: DC

## 2016-10-05 MED ORDER — LISINOPRIL 40 MG PO TABS: 40.0000 mg | ORAL_TABLET | Freq: Every day | ORAL | 0 refills | Status: DC

## 2016-10-05 MED ORDER — TESTOSTERONE 10 MG/ACT (2%) TD GEL: TRANSDERMAL | 0 refills | Status: DC

## 2016-10-05 NOTE — Telephone Encounter (Signed)
Go ahead and refill medications, let him know the testosterone has to be picked up because his controlled. Also let him know that he should come back in the next few months for a follow-up appointment.

## 2016-10-05 NOTE — Telephone Encounter (Signed)
Prescriptions sent in, Devin Santiago to place testosterone prescription up front for pick up.  Patient notified, followup appointment 10/13/16 at 8:10 am.

## 2016-10-05 NOTE — Telephone Encounter (Signed)
What is the name of the medication? Viagra, trazadone, testosterone, omeprazole, lisinopril. 90 days with refills.  Have you contacted your pharmacy to request a refill? No , it is mail order  Which pharmacy would you like this sent to? Mail order   Patient notified that their request is being sent to the clinical staff for review and that they should receive a call once it is complete. If they do not receive a call within 24 hours they can check with their pharmacy or our office.

## 2016-10-13 ENCOUNTER — Encounter: Payer: Self-pay | Admitting: Family Medicine

## 2016-10-13 ENCOUNTER — Ambulatory Visit (INDEPENDENT_AMBULATORY_CARE_PROVIDER_SITE_OTHER): Admitting: Family Medicine

## 2016-10-13 VITALS — BP 132/85 | HR 69 | Temp 98.3°F | Ht 66.0 in | Wt 205.0 lb

## 2016-10-13 DIAGNOSIS — Z13220 Encounter for screening for lipoid disorders: Secondary | ICD-10-CM | POA: Diagnosis not present

## 2016-10-13 DIAGNOSIS — K219 Gastro-esophageal reflux disease without esophagitis: Secondary | ICD-10-CM

## 2016-10-13 DIAGNOSIS — E349 Endocrine disorder, unspecified: Secondary | ICD-10-CM | POA: Diagnosis not present

## 2016-10-13 DIAGNOSIS — Z1159 Encounter for screening for other viral diseases: Secondary | ICD-10-CM

## 2016-10-13 DIAGNOSIS — I10 Essential (primary) hypertension: Secondary | ICD-10-CM

## 2016-10-13 DIAGNOSIS — Z125 Encounter for screening for malignant neoplasm of prostate: Secondary | ICD-10-CM

## 2016-10-13 MED ORDER — OMEPRAZOLE 40 MG PO CPDR: DELAYED_RELEASE_CAPSULE | ORAL | 0 refills | Status: DC

## 2016-10-13 MED ORDER — LISINOPRIL 40 MG PO TABS: 40.0000 mg | ORAL_TABLET | Freq: Every day | ORAL | 0 refills | Status: DC

## 2016-10-13 MED ORDER — TESTOSTERONE 10 MG/ACT (2%) TD GEL: TRANSDERMAL | 0 refills | Status: DC

## 2016-10-13 NOTE — Progress Notes (Signed)
BP 132/85   Pulse 69   Temp 98.3 F (36.8 C) (Oral)   Ht 5' 6"  (1.676 m)   Wt 205 lb (93 kg)   BMI 33.09 kg/m    Subjective:    Patient ID: Devin Santiago, male    DOB: Jan 11, 1956, 62 y.o.   MRN: [id number]  HPI: Devin Santiago is a 61 y.o. male presenting on 10/13/2016 for Hypertension (followup)   HPI Hypertension recheck Patient is coming in for hypertension recheck. He is currently on lisinopril. His blood pressure today is 132/85. Patient denies headaches, blurred vision, chest pains, shortness of breath, or weakness. Denies any side effects from medication and is content with current medication.   Testosterone deficiency recheck Patient is currently on testosterone gel and needs a refill for this. He has been using it for some time. His last hemoglobin was 17 and we're going to recheck that today. He says the testosterone does give him a lot more energy and as needed. He denies any chest pain or shortness of breath or arthralgias.  GERD recheck Patient is coming in for recheck of his GERD. He is currently on omeprazole 40 mg. He says it works well for him and denies any blood in his stool or abdominal pain or heartburn.  Relevant past medical, surgical, family and social history reviewed and updated as indicated. Interim medical history since our last visit reviewed. Allergies and medications reviewed and updated.  Review of Systems  Constitutional: Negative for chills and fever.  Eyes: Negative for discharge.  Respiratory: Negative for shortness of breath and wheezing.   Cardiovascular: Negative for chest pain and leg swelling.  Gastrointestinal: Negative for abdominal pain, blood in stool and diarrhea.  Musculoskeletal: Negative for back pain and gait problem.  Skin: Negative for rash.  Neurological: Negative for dizziness, weakness, numbness and headaches.  All other systems reviewed and are negative.   Per HPI unless specifically indicated above     Objective:    BP  132/85   Pulse 69   Temp 98.3 F (36.8 C) (Oral)   Ht 5' 6"  (1.676 m)   Wt 205 lb (93 kg)   BMI 33.09 kg/m   Wt Readings from Last 3 Encounters:  10/13/16 205 lb (93 kg)  05/12/16 205 lb (93 kg)  03/13/16 204 lb 12.8 oz (92.9 kg)    Physical Exam  Constitutional: He is oriented to person, place, and time. He appears well-developed and well-nourished. No distress.  Eyes: Conjunctivae are normal. No scleral icterus.  Cardiovascular: Normal rate, regular rhythm, normal heart sounds and intact distal pulses.   No murmur heard. Pulmonary/Chest: Effort normal and breath sounds normal. No respiratory distress. He has no wheezes. He has no rales.  Musculoskeletal: Normal range of motion. He exhibits no edema.  Neurological: He is alert and oriented to person, place, and time. Coordination normal.  Skin: Skin is warm and dry. No rash noted. He is not diaphoretic.  Psychiatric: He has a normal mood and affect. His behavior is normal.  Nursing note and vitals reviewed.     Assessment & Plan:   Problem List Items Addressed This Visit      Cardiovascular and Mediastinum   Essential hypertension - Primary   Relevant Medications   lisinopril (PRINIVIL,ZESTRIL) 40 MG tablet   Other Relevant Orders   Lipid panel (Completed)   CMP14+EGFR (Completed)     Digestive   GERD (gastroesophageal reflux disease)   Relevant Medications   omeprazole (PRILOSEC) 40 MG  capsule   Other Relevant Orders   CBC with Differential/Platelet (Completed)     Other   Testosterone deficiency   Relevant Orders   CBC with Differential/Platelet (Completed)    Other Visit Diagnoses    Lipid screening       Relevant Orders   Lipid panel (Completed)   Prostate cancer screening       Relevant Orders   PSA, total and free (Completed)   Need for hepatitis C screening test       Relevant Orders   Hepatitis C antibody (Completed)      Follow up plan: Return in about 6 months (around 04/15/2017), or if symptoms  worsen or fail to improve.  Counseling provided for all of the vaccine components Orders Placed This Encounter  Procedures  . CBC with Differential/Platelet  . Lipid panel  . CMP14+EGFR  . PSA, total and free  . Hepatitis C antibody    Caryl Pina, MD [facility] 10/13/2016, 8:55 AM

## 2016-10-14 LAB — CBC WITH DIFFERENTIAL/PLATELET
BASOS: 1 %
Basophils Absolute: 0 10*3/uL (ref 0.0–0.2)
EOS (ABSOLUTE): 0.3 10*3/uL (ref 0.0–0.4)
Eos: 4 %
HEMOGLOBIN: 17.2 g/dL (ref 13.0–17.7)
Hematocrit: 51.1 % — ABNORMAL HIGH (ref 37.5–51.0)
IMMATURE GRANS (ABS): 0 10*3/uL (ref 0.0–0.1)
IMMATURE GRANULOCYTES: 0 %
LYMPHS: 30 %
Lymphocytes Absolute: 1.9 10*3/uL (ref 0.7–3.1)
MCH: 29.2 pg (ref 26.6–33.0)
MCHC: 33.7 g/dL (ref 31.5–35.7)
MCV: 87 fL (ref 79–97)
MONOCYTES: 6 %
Monocytes Absolute: 0.4 10*3/uL (ref 0.1–0.9)
NEUTROS ABS: 3.8 10*3/uL (ref 1.4–7.0)
NEUTROS PCT: 59 %
Platelets: 197 10*3/uL (ref 150–379)
RBC: 5.89 x10E6/uL — ABNORMAL HIGH (ref 4.14–5.80)
RDW: 13.2 % (ref 12.3–15.4)
WBC: 6.4 10*3/uL (ref 3.4–10.8)

## 2016-10-14 LAB — CMP14+EGFR
A/G RATIO: 1.7 (ref 1.2–2.2)
ALT: 24 IU/L (ref 0–44)
AST: 23 IU/L (ref 0–40)
Albumin: 4.5 g/dL (ref 3.5–5.5)
Alkaline Phosphatase: 53 IU/L (ref 39–117)
BUN/Creatinine Ratio: 16 (ref 9–20)
BUN: 21 mg/dL (ref 6–24)
Bilirubin Total: 0.8 mg/dL (ref 0.0–1.2)
CALCIUM: 9.6 mg/dL (ref 8.7–10.2)
CO2: 21 mmol/L (ref 18–29)
CREATININE: 1.31 mg/dL — AB (ref 0.76–1.27)
Chloride: 101 mmol/L (ref 96–106)
GFR, EST AFRICAN AMERICAN: 68 mL/min/{1.73_m2} (ref >59)
GFR, EST NON AFRICAN AMERICAN: 59 mL/min/{1.73_m2} — AB (ref >59)
Globulin, Total: 2.6 g/dL (ref 1.5–4.5)
Glucose: 93 mg/dL (ref 65–99)
Potassium: 4.5 mmol/L (ref 3.5–5.2)
Sodium: 140 mmol/L (ref 134–144)
TOTAL PROTEIN: 7.1 g/dL (ref 6.0–8.5)

## 2016-10-14 LAB — LIPID PANEL
CHOL/HDL RATIO: 4.9 ratio (ref 0.0–5.0)
Cholesterol, Total: 141 mg/dL (ref 100–199)
HDL: 29 mg/dL — ABNORMAL LOW (ref >39)
LDL Calculated: 96 mg/dL (ref 0–99)
Triglycerides: 81 mg/dL (ref 0–149)
VLDL Cholesterol Cal: 16 mg/dL (ref 5–40)

## 2016-10-14 LAB — PSA, TOTAL AND FREE
PSA, Free Pct: 40 %
PSA, Free: 0.28 ng/mL
Prostate Specific Ag, Serum: 0.7 ng/mL (ref 0.0–4.0)

## 2016-10-14 LAB — HEPATITIS C ANTIBODY

## 2016-12-25 ENCOUNTER — Telehealth: Payer: Self-pay | Admitting: Family Medicine

## 2016-12-25 ENCOUNTER — Other Ambulatory Visit: Payer: Self-pay

## 2016-12-25 MED ORDER — TESTOSTERONE 10 MG/ACT (2%) TD GEL: TRANSDERMAL | 1 refills | Status: DC

## 2016-12-25 NOTE — Telephone Encounter (Signed)
Go ahead and print refills and have patient come pick up.

## 2016-12-25 NOTE — Telephone Encounter (Signed)
What is the name of the medication? Fortesta Testosterone Gel 10 mg  Have you contacted your pharmacy to request a refill? YES 4-17  Which pharmacy would you like this sent to? [facility]   Patient notified that their request is being sent to the clinical staff for review and that they should receive a call once it is complete. If they do not receive a call within 24 hours they can check with their pharmacy or our office.

## 2017-01-10 ENCOUNTER — Other Ambulatory Visit: Payer: Self-pay | Admitting: *Deleted

## 2017-01-10 MED ORDER — TESTOSTERONE 10 MG/ACT (2%) TD GEL: TRANSDERMAL | 0 refills | Status: DC

## 2017-01-10 NOTE — Telephone Encounter (Signed)
Patient aware.

## 2017-01-10 NOTE — Telephone Encounter (Signed)
Please address refill on test gel = please print - [facility].  Call pt when ready for pick up

## 2017-01-10 NOTE — Telephone Encounter (Signed)
Will Santiago Glad prescription and patient can come pick it up.

## 2017-01-16 ENCOUNTER — Other Ambulatory Visit: Payer: Self-pay | Admitting: Family Medicine

## 2017-01-16 DIAGNOSIS — K219 Gastro-esophageal reflux disease without esophagitis: Secondary | ICD-10-CM

## 2017-01-16 DIAGNOSIS — G4700 Insomnia, unspecified: Secondary | ICD-10-CM

## 2017-01-19 ENCOUNTER — Encounter: Payer: Self-pay | Admitting: Family Medicine

## 2017-01-19 ENCOUNTER — Ambulatory Visit (INDEPENDENT_AMBULATORY_CARE_PROVIDER_SITE_OTHER): Admitting: Family Medicine

## 2017-01-19 VITALS — BP 130/85 | HR 66 | Temp 97.8°F | Ht 66.0 in | Wt 208.0 lb

## 2017-01-19 DIAGNOSIS — S61451D Open bite of right hand, subsequent encounter: Secondary | ICD-10-CM | POA: Diagnosis not present

## 2017-01-19 DIAGNOSIS — W540XXD Bitten by dog, subsequent encounter: Secondary | ICD-10-CM

## 2017-01-19 MED ORDER — TESTOSTERONE 10 MG/ACT (2%) TD GEL: TRANSDERMAL | 0 refills | Status: DC

## 2017-01-19 MED ORDER — LISINOPRIL 40 MG PO TABS: 40.0000 mg | ORAL_TABLET | Freq: Every day | ORAL | 0 refills | Status: DC

## 2017-01-19 NOTE — Progress Notes (Signed)
BP 130/85   Pulse 66   Temp 97.8 F (36.6 C) (Oral)   Ht 5\' 6"  (1.676 m)   Wt 208 lb (94.3 kg)   BMI 33.57 kg/m    Subjective:    Patient ID: Devin Santiago, male    DOB: 03-10-1956, 61 y.o.   MRN: [id number]  HPI: Devin Santiago is a 61 y.o. male presenting on 01/19/2017 for Animal Bite (happened on Sunday, went to [facility] )   HPI Dog Bite, Right Hand Patient sustained A dog bite on 01/14/2017. A pit bull came and attacked his dog and he tried to intervene and was bitten by either his dog or the pitbull. When spoke with the owners of the pain although he said it was a rescue dog but that it had all of its shots and was up to date and had not had any signs of rabies. The patient's dog also has all of his shots are up-to-date. He was seen at [facility] emergency department and the wound was cleaned and he was given antibiotics of Keflex to take home with him. He has been taking the Keflex and has been wrapping and covering the wound with triple antibiotic ointment. He has a picture of the wound and compared to today it is healing up and started closing nicely. It was recommended by the emergency department to let it heal by secondary intention because of possible risk for infection. He says that the dog has been quarantined and they are watching it. He denies any numbness or weakness in the hand. He has pain only around where the actual cut/bite is but is otherwise feeling pretty well.  Relevant past medical, surgical, family and social history reviewed and updated as indicated. Interim medical history since our last visit reviewed. Allergies and medications reviewed and updated.  Review of Systems  Constitutional: Negative for chills and fever.  Respiratory: Negative for shortness of breath and wheezing.   Cardiovascular: Negative for chest pain and leg swelling.  Musculoskeletal: Negative for back pain and gait problem.  Skin: Positive for wound. Negative for color change  and rash.  All other systems reviewed and are negative.   Per HPI unless specifically indicated above        Objective:    BP 130/85   Pulse 66   Temp 97.8 F (36.6 C) (Oral)   Ht 5\' 6"  (1.676 m)   Wt 208 lb (94.3 kg)   BMI 33.57 kg/m   Wt Readings from Last 3 Encounters:  01/19/17 208 lb (94.3 kg)  10/13/16 205 lb (93 kg)  05/12/16 205 lb (93 kg)    Physical Exam  Constitutional: He is oriented to person, place, and time. He appears well-developed and well-nourished. No distress.  Eyes: Conjunctivae are normal. No scleral icterus.  Musculoskeletal: Normal range of motion. He exhibits no edema.  Neurological: He is alert and oriented to person, place, and time. Coordination normal.  Skin: Skin is warm and dry. Laceration (2.5 cm laceration on palmar surface of r hand, open and healing from secondary intention. no signs of erythema or drainage) noted. No rash noted. He is not diaphoretic.  Psychiatric: He has a normal mood and affect. His behavior is normal.  Nursing note and vitals reviewed.  Patient also has small abrasions on the dorsum of his hand in 3 areas that are healing well.      Assessment & Plan:   Problem List Items Addressed This Visit    None  Visit Diagnoses    Dog bite of right hand, subsequent encounter    -  Primary       Follow up plan: Return if symptoms worsen or fail to improve.  Counseling provided for all of the vaccine components No orders of the defined types were placed in this encounter.   Caryl Pina, MD [facility] 01/19/2017, 8:40 AM

## 2017-02-05 ENCOUNTER — Ambulatory Visit (INDEPENDENT_AMBULATORY_CARE_PROVIDER_SITE_OTHER): Admitting: Family Medicine

## 2017-02-05 ENCOUNTER — Encounter: Payer: Self-pay | Admitting: Family Medicine

## 2017-02-05 VITALS — BP 129/84 | HR 68 | Temp 97.9°F | Ht 66.0 in | Wt 212.4 lb

## 2017-02-05 DIAGNOSIS — R2231 Localized swelling, mass and lump, right upper limb: Secondary | ICD-10-CM | POA: Diagnosis not present

## 2017-02-05 DIAGNOSIS — Z7184 Encounter for health counseling related to travel: Secondary | ICD-10-CM

## 2017-02-05 DIAGNOSIS — Z7189 Other specified counseling: Secondary | ICD-10-CM | POA: Diagnosis not present

## 2017-02-05 MED ORDER — AZITHROMYCIN 250 MG PO TABS: ORAL_TABLET | ORAL | 0 refills | Status: DC

## 2017-02-05 NOTE — Progress Notes (Signed)
BP 129/84   Pulse 68   Temp 97.9 F (36.6 C) (Oral)   Ht 5\' 6"  (1.676 m)   Wt 212 lb 6.4 oz (96.3 kg)   BMI 34.28 kg/m    Subjective:    Patient ID: Devin Santiago, male    DOB: 05-Nov-1955, 61 y.o.   MRN: [id number]  HPI: Gill Delrossi is a 61 y.o. male presenting on 02/05/2017 for recheck bite on right hand   HPI Follow-up dog bite right hand Patient is coming in for follow-up with Dr. Dema Severin in his right hand. Both of the wound sites on the palmar aspect of his right hand and the smaller abrasion that he had overlying the middle metacarpal bone and appeared to healed up nicely. There is still some swelling extending up through his middle finger that makes it slightly larger than the other hand. He has full range of motion and sensation and there is no redness or warmth or tenderness associated with it. He denies any fevers or chills.  Patient is traveling to [place] this coming week and wants something just in case he gets traveler's diarrhea which he has had previously when he goes there.  Relevant past medical, surgical, family and social history reviewed and updated as indicated. Interim medical history since our last visit reviewed. Allergies and medications reviewed and updated.  Review of Systems  Constitutional: Negative for chills and fever.  Respiratory: Negative for shortness of breath and wheezing.   Cardiovascular: Negative for chest pain and leg swelling.  Musculoskeletal: Positive for joint swelling. Negative for arthralgias, back pain and gait problem.  Skin: Negative for color change, rash and wound.  All other systems reviewed and are negative.   Per HPI unless specifically indicated above        Objective:    BP 129/84   Pulse 68   Temp 97.9 F (36.6 C) (Oral)   Ht 5\' 6"  (1.676 m)   Wt 212 lb 6.4 oz (96.3 kg)   BMI 34.28 kg/m   Wt Readings from Last 3 Encounters:  02/05/17 212 lb 6.4 oz (96.3 kg)  01/19/17 208 lb (94.3 kg)  10/13/16 205 lb (93 kg)      Physical Exam  Constitutional: He is oriented to person, place, and time. He appears well-developed and well-nourished. No distress.  Eyes: Conjunctivae are normal. No scleral icterus.  Cardiovascular: Normal rate, regular rhythm, normal heart sounds and intact distal pulses.   No murmur heard. Pulmonary/Chest: Effort normal and breath sounds normal. No respiratory distress. He has no wheezes. He has no rales.  Musculoskeletal: Normal range of motion. He exhibits no edema or tenderness.       Right hand: He exhibits swelling (Slightly swollen middle finger, no tenderness). He exhibits no tenderness, normal capillary refill and no deformity. Normal sensation noted. Normal strength noted.  Neurological: He is alert and oriented to person, place, and time. Coordination normal.  Skin: Skin is warm and dry. No rash noted. He is not diaphoretic.  Psychiatric: He has a normal mood and affect. His behavior is normal.  Nursing note and vitals reviewed.       Assessment & Plan:   Problem List Items Addressed This Visit    None    Visit Diagnoses    Localized swelling on right hand    -  Primary   Patient still swollen around middle finger, has grip strength intact and full range of motion. Ice and ibuprofen. Wounds are healed up well  Travel advice encounter       Relevant Medications   azithromycin (ZITHROMAX) 250 MG tablet       Follow up plan: Return if symptoms worsen or fail to improve.  Counseling provided for all of the vaccine components No orders of the defined types were placed in this encounter.   Caryl Pina, MD [facility] 02/05/2017, 9:08 AM

## 2017-03-11 ENCOUNTER — Other Ambulatory Visit: Payer: Self-pay | Admitting: Family Medicine

## 2017-03-12 ENCOUNTER — Ambulatory Visit (INDEPENDENT_AMBULATORY_CARE_PROVIDER_SITE_OTHER): Admitting: Family Medicine

## 2017-03-12 ENCOUNTER — Encounter: Payer: Self-pay | Admitting: Family Medicine

## 2017-03-12 VITALS — BP 127/89 | HR 76 | Temp 97.7°F | Ht 66.0 in | Wt 216.0 lb

## 2017-03-12 DIAGNOSIS — R2231 Localized swelling, mass and lump, right upper limb: Secondary | ICD-10-CM

## 2017-03-12 DIAGNOSIS — S61451D Open bite of right hand, subsequent encounter: Secondary | ICD-10-CM | POA: Diagnosis not present

## 2017-03-12 DIAGNOSIS — W540XXD Bitten by dog, subsequent encounter: Secondary | ICD-10-CM

## 2017-03-12 MED ORDER — PREDNISONE 20 MG PO TABS: ORAL_TABLET | ORAL | 0 refills | Status: DC

## 2017-03-12 NOTE — Progress Notes (Signed)
BP 127/89   Pulse 76   Temp 97.7 F (36.5 C) (Oral)   Ht 5\' 6"  (1.676 m)   Wt 216 lb (98 kg)   BMI 34.86 kg/m    Subjective:    Patient ID: Devin Santiago, male    DOB: 20-Jan-1956, 61 y.o.   MRN: [id number]  HPI: Devin Santiago is a 61 y.o. male presenting on 03/12/2017 for Pain, swelling and swelling in 3rd digit of right hand (still having some problems)   HPI Swelling of the third finger and right hand Patient comes in today with complaints of swelling around the third finger on his right hand. He sustained a dog bite there in early June just over a month and a half ago. He denies much pain associated with it but still has a little bit of pain but his middle finger is just still swollen all the way up that. He is also unable to fully extend his PIP joint and is hand on his middle finger. There is no redness or warmth. The dog bites where he had sustained is 1 small bite on the dorsum of his hand near the third metacarpal and a larger gash near the base of his right thumb. Those appear to be both healed well and have no signs of infection. He denies any numbness or weakness in that hand or finger.  Relevant past medical, surgical, family and social history reviewed and updated as indicated. Interim medical history since our last visit reviewed. Allergies and medications reviewed and updated.  Review of Systems  Constitutional: Negative for chills and fever.  Eyes: Negative for discharge.  Respiratory: Negative for shortness of breath and wheezing.   Cardiovascular: Negative for chest pain and leg swelling.  Musculoskeletal: Positive for joint swelling. Negative for arthralgias, back pain and gait problem.  Skin: Negative for color change and rash.  All other systems reviewed and are negative.   Per HPI unless specifically indicated above        Objective:    BP 127/89   Pulse 76   Temp 97.7 F (36.5 C) (Oral)   Ht 5\' 6"  (1.676 m)   Wt 216 lb (98 kg)   BMI 34.86 kg/m     Wt Readings from Last 3 Encounters:  03/12/17 216 lb (98 kg)  02/05/17 212 lb 6.4 oz (96.3 kg)  01/19/17 208 lb (94.3 kg)    Physical Exam  Constitutional: He is oriented to person, place, and time. He appears well-developed and well-nourished. No distress.  Eyes: Conjunctivae are normal. No scleral icterus.  Cardiovascular: Normal rate, regular rhythm, normal heart sounds and intact distal pulses.   No murmur heard. Pulmonary/Chest: Effort normal and breath sounds normal. No respiratory distress. He has no wheezes. He has no rales.  Musculoskeletal: Normal range of motion. He exhibits no edema.       Hands: Neurological: He is alert and oriented to person, place, and time. Coordination normal.  Skin: Skin is warm and dry. No rash noted. He is not diaphoretic.  Psychiatric: He has a normal mood and affect. His behavior is normal.  Nursing note and vitals reviewed.     Assessment & Plan:   Problem List Items Addressed This Visit    None    Visit Diagnoses    Localized swelling on right hand    -  Primary   No signs of infection or significant pain but still has swelling in the middle finger of right hand. Recommended to go  see orthopedic   Relevant Medications   predniSONE (DELTASONE) 20 MG tablet   Other Relevant Orders   Ambulatory referral to Orthopedic Surgery   Dog bite of right hand, subsequent encounter       Relevant Medications   predniSONE (DELTASONE) 20 MG tablet   Other Relevant Orders   Ambulatory referral to Orthopedic Surgery       Follow up plan: Return if symptoms worsen or fail to improve.  Counseling provided for all of the vaccine components Orders Placed This Encounter  Procedures  . Ambulatory referral to Glenbeulah Dettinger, MD [facility] 03/12/2017, 2:12 PM

## 2017-03-22 ENCOUNTER — Ambulatory Visit (INDEPENDENT_AMBULATORY_CARE_PROVIDER_SITE_OTHER): Admitting: Orthopaedic Surgery

## 2017-03-22 VITALS — BP 137/81 | HR 71 | Ht 66.0 in | Wt 215.0 lb

## 2017-03-22 DIAGNOSIS — M7989 Other specified soft tissue disorders: Secondary | ICD-10-CM | POA: Diagnosis not present

## 2017-03-22 DIAGNOSIS — W540XXA Bitten by dog, initial encounter: Secondary | ICD-10-CM

## 2017-03-22 DIAGNOSIS — M79641 Pain in right hand: Secondary | ICD-10-CM | POA: Diagnosis not present

## 2017-03-22 NOTE — Progress Notes (Signed)
Office Visit Note   Patient: Devin Santiago           Date of Birth: [birth year]           MRN: [id number] Visit Date: 03/22/2017              Requested by: Dettinger, Fransisca Kaufmann, MD [place], [place] [id number] PCP: Dettinger, Fransisca Kaufmann, MD   Assessment & Plan: Visit Diagnoses:  1. Pain in right hand   2. Swelling of right hand   3. Dog bite, initial encounter     Plan: Dog bite with intact neurovascular exam. No evidence of flexor tenosynovitis. No evidence of arthrosis of the digits or hand. Neurologic exam is intact. He can return if he has increased problems.  Follow-Up Instructions: No Follow-up on file.   Orders:  No orders of the defined types were placed in this encounter.  No orders of the defined types were placed in this encounter.     Procedures: No procedures performed   Clinical Data: No additional findings.   Subjective: Chief Complaint  Patient presents with  . Right Hand - Pain, Injury    HPI Devin Santiago and-year-old male got his right hand bit by epidural on 01/15/2017. He had the lacerations over the hyperthenar lateral aspect and also some puncture wounds of the dorsum of the hand. He still has some residual swelling is able to use his hand with pretty normal activities he notices it if is not using his hand he has some swelling and mild stiffness. Denies any fever chills no numbness or tingling in his finger. Patient's in sales position.  Review of Systems via systems performed a positive for history of hypertension GERD insomnia dog bite otherwise negative as it pertains history of present illness.   Objective: Vital Signs: BP 137/81   Pulse 71   Ht 5\' 6"  (1.676 m)   Wt 215 lb (97.5 kg)   BMI 34.70 kg/m   Physical Exam  Constitutional: He is oriented to person, place, and time. He appears well-developed and well-nourished.  HENT:  Head: Normocephalic and atraumatic.  Eyes: Pupils are equal, round, and reactive to light. EOM are normal.    Neck: No tracheal deviation present. No thyromegaly present.  Cardiovascular: Normal rate.   Pulmonary/Chest: Effort normal. He has no wheezes.  Abdominal: Soft. Bowel sounds are normal.  Neurological: He is alert and oriented to person, place, and time.  Skin: Skin is warm and dry. Capillary refill takes less than 2 seconds.  Psychiatric: He has a normal mood and affect. His behavior is normal. Judgment and thought content normal.    Ortho Exam mild hand swelling good flexion extension of all digits. He has some puncture wounds over the dorsum the hand proximal to the MCP joints. Full function of all extensors. Laceration over the ulnar hyperthenar region of the hand without drainage. Ulnar sensation is intact to the digits with intact first dorsal interosseous function.  Specialty Comments:  No specialty comments available.  Imaging: No results found.   PMFS History: Patient Active Problem List   Diagnosis Date Noted  . Pain in right hand 03/22/2017  . Swelling of right hand 03/22/2017  . Dog bite 03/22/2017  . Essential hypertension 03/03/2015  . GERD (gastroesophageal reflux disease) 03/03/2015  . Erectile dysfunction 03/03/2015  . Testosterone deficiency 03/03/2015  . Insomnia 03/03/2015   Past Medical History:  Diagnosis Date  . GERD (gastroesophageal reflux disease)   . Hypertension   .  Insomnia     Family History  Problem Relation Age of Onset  . Diabetes Mother   . Heart disease Mother     No past surgical history on file. Social History   Occupational History  . Not on file.   Social History Main Topics  . Smoking status: Former Smoker    Types: Cigarettes    Quit date: 07/17/1998  . Smokeless tobacco: Never Used  . Alcohol use Yes     Comment: occ  . Drug use: No  . Sexual activity: Not on file

## 2017-05-15 ENCOUNTER — Telehealth: Payer: Self-pay | Admitting: Family Medicine

## 2017-05-15 DIAGNOSIS — D229 Melanocytic nevi, unspecified: Secondary | ICD-10-CM

## 2017-05-15 NOTE — Telephone Encounter (Signed)
Please review and advise.

## 2017-05-16 ENCOUNTER — Other Ambulatory Visit: Payer: Self-pay | Admitting: *Deleted

## 2017-05-16 DIAGNOSIS — Z1211 Encounter for screening for malignant neoplasm of colon: Secondary | ICD-10-CM

## 2017-05-16 NOTE — Telephone Encounter (Signed)
Go ahead and do a referral for him

## 2017-05-16 NOTE — Telephone Encounter (Signed)
Sounds good thanks

## 2017-05-16 NOTE — Telephone Encounter (Signed)
Aware.  Referral for dermatology done.  Patient said he had a colonoscopy 10 years back and wanted to do another since he would soon be 61 years old.  He does not have any issues , just wants a screening.  Referral was done .  If you disagree, please let me know.

## 2017-06-12 ENCOUNTER — Other Ambulatory Visit: Payer: Self-pay | Admitting: Family Medicine

## 2017-06-12 DIAGNOSIS — K219 Gastro-esophageal reflux disease without esophagitis: Secondary | ICD-10-CM

## 2017-06-12 DIAGNOSIS — G4700 Insomnia, unspecified: Secondary | ICD-10-CM

## 2017-06-12 NOTE — Telephone Encounter (Signed)
Patient last seen for chronic follow up in March 2018.  Can we send in a 3 month script for meds?

## 2017-06-13 MED ORDER — TRAZODONE HCL 50 MG PO TABS: ORAL_TABLET | ORAL | 0 refills | Status: DC

## 2017-06-13 MED ORDER — LISINOPRIL 40 MG PO TABS: 40.0000 mg | ORAL_TABLET | Freq: Every day | ORAL | 0 refills | Status: DC

## 2017-06-13 MED ORDER — OMEPRAZOLE 40 MG PO CPDR: 40.0000 mg | DELAYED_RELEASE_CAPSULE | Freq: Every day | ORAL | 0 refills | Status: DC

## 2017-06-13 NOTE — Telephone Encounter (Signed)
Rx sent and patient aware 

## 2017-06-13 NOTE — Telephone Encounter (Signed)
Yes go ahead and send in 3 months of prescriptions

## 2017-06-27 ENCOUNTER — Telehealth: Payer: Self-pay | Admitting: Family Medicine

## 2017-06-27 NOTE — Telephone Encounter (Signed)
What is the name of the medication?  Testosterone 10 MG/ACT (2%) GEL 180 g 0        Have you contacted your pharmacy to request a refill? yes  Which pharmacy would you like this sent to? [email or web address]   Patient notified that their request is being sent to the clinical staff for review and that they should receive a call once it is complete. If they do not receive a call within 24 hours they can check with their pharmacy or our office.

## 2017-06-27 NOTE — Telephone Encounter (Signed)
Please review and advise.

## 2017-06-28 MED ORDER — TESTOSTERONE 10 MG/ACT (2%) TD GEL: TRANSDERMAL | 1 refills | Status: DC

## 2017-06-28 NOTE — Telephone Encounter (Signed)
We can go ahead and call in a refill, I do not know if this 1 can be called to [facility] because it is controlled, we may have to printed out and have him come pick it up.

## 2017-06-28 NOTE — Telephone Encounter (Signed)
Patient will pick up printed Rx

## 2017-07-10 ENCOUNTER — Ambulatory Visit (INDEPENDENT_AMBULATORY_CARE_PROVIDER_SITE_OTHER): Admitting: Family Medicine

## 2017-07-10 ENCOUNTER — Encounter: Payer: Self-pay | Admitting: Family Medicine

## 2017-07-10 VITALS — BP 138/78 | HR 82 | Temp 97.2°F | Ht 66.0 in | Wt 224.0 lb

## 2017-07-10 DIAGNOSIS — J329 Chronic sinusitis, unspecified: Secondary | ICD-10-CM | POA: Diagnosis not present

## 2017-07-10 MED ORDER — BETAMETHASONE SOD PHOS & ACET 6 (3-3) MG/ML IJ SUSP
6.0000 mg | Freq: Once | INTRAMUSCULAR | Status: AC
  Administered 2017-07-10: 6 mg via INTRAMUSCULAR

## 2017-07-10 MED ORDER — FEXOFENADINE-PSEUDOEPHED ER 180-240 MG PO TB24: 1.0000 | ORAL_TABLET | Freq: Every day | ORAL | 0 refills | Status: DC

## 2017-07-10 NOTE — Progress Notes (Signed)
[facility]

## 2017-07-10 NOTE — Progress Notes (Signed)
Chief Complaint  Patient presents with  . Nasal Congestion    pt here today c/o cough and congestion    HPI  Patient presents today for Patient presents with upper respiratory congestion. Rhinorrhea that is frequently purulent. There is moderate sore throat. Patient reports no fever, chills or sweats. The patient denies being short of breath. Onset was 3-5 days ago. Gradually worsening. Tried OTCs without improvement.  PMH: Smoking status noted ROS: Per HPI  Objective: BP 138/78   Pulse 82   Temp (!) 97.2 F (36.2 C) (Oral)   Ht 5\' 6"  (1.676 m)   Wt 224 lb (101.6 kg)   BMI 36.15 kg/m  Gen: NAD, alert, cooperative with exam HEENT: NCAT, Nasal passages swollen, red TMS RED CV: RRR, good S1/S2, no murmur Resp: Bronchitis changes with scattered wheezes, non-labored Ext: No edema, warm Neuro: Alert and oriented, No gross deficits  Assessment and plan:  1. Sinusitis, unspecified chronicity, unspecified location     Meds ordered this encounter  Medications  . betamethasone acetate-betamethasone sodium phosphate (CELESTONE) injection 6 mg  . fexofenadine-pseudoephedrine (ALLEGRA-D 24) 180-240 MG 24 hr tablet    Sig: Take 1 tablet by mouth daily.    Dispense:  10 tablet    Refill:  0    No orders of the defined types were placed in this encounter.   Follow up as needed.  Devin Fraise, MD

## 2018-01-24 ENCOUNTER — Other Ambulatory Visit: Payer: Self-pay | Admitting: Family Medicine

## 2018-01-24 DIAGNOSIS — K219 Gastro-esophageal reflux disease without esophagitis: Secondary | ICD-10-CM

## 2018-01-24 DIAGNOSIS — G4700 Insomnia, unspecified: Secondary | ICD-10-CM

## 2018-02-18 ENCOUNTER — Encounter: Payer: Self-pay | Admitting: Family Medicine

## 2018-02-18 ENCOUNTER — Ambulatory Visit (INDEPENDENT_AMBULATORY_CARE_PROVIDER_SITE_OTHER): Admitting: Family Medicine

## 2018-02-18 VITALS — BP 125/84 | HR 71 | Temp 98.4°F | Ht 66.0 in | Wt 198.1 lb

## 2018-02-18 DIAGNOSIS — I10 Essential (primary) hypertension: Secondary | ICD-10-CM | POA: Diagnosis not present

## 2018-02-18 DIAGNOSIS — K219 Gastro-esophageal reflux disease without esophagitis: Secondary | ICD-10-CM

## 2018-02-18 DIAGNOSIS — N179 Acute kidney failure, unspecified: Secondary | ICD-10-CM | POA: Diagnosis not present

## 2018-02-18 DIAGNOSIS — E349 Endocrine disorder, unspecified: Secondary | ICD-10-CM

## 2018-02-18 DIAGNOSIS — G4700 Insomnia, unspecified: Secondary | ICD-10-CM

## 2018-02-18 DIAGNOSIS — N529 Male erectile dysfunction, unspecified: Secondary | ICD-10-CM | POA: Diagnosis not present

## 2018-02-18 LAB — CBC WITH DIFFERENTIAL/PLATELET
BASOS ABS: 0.1 10*3/uL (ref 0.0–0.2)
BASOS: 1 %
EOS (ABSOLUTE): 0.2 10*3/uL (ref 0.0–0.4)
Eos: 3 %
HEMOGLOBIN: 17.9 g/dL — AB (ref 13.0–17.7)
Hematocrit: 53.8 % — ABNORMAL HIGH (ref 37.5–51.0)
IMMATURE GRANS (ABS): 0 10*3/uL (ref 0.0–0.1)
IMMATURE GRANULOCYTES: 0 %
LYMPHS: 23 %
Lymphocytes Absolute: 1.9 10*3/uL (ref 0.7–3.1)
MCH: 28.9 pg (ref 26.6–33.0)
MCHC: 33.3 g/dL (ref 31.5–35.7)
MCV: 87 fL (ref 79–97)
MONOCYTES: 7 %
Monocytes Absolute: 0.6 10*3/uL (ref 0.1–0.9)
NEUTROS ABS: 5.6 10*3/uL (ref 1.4–7.0)
NEUTROS PCT: 66 %
PLATELETS: 211 10*3/uL (ref 150–450)
RBC: 6.2 x10E6/uL — ABNORMAL HIGH (ref 4.14–5.80)
RDW: 13.4 % (ref 12.3–15.4)
WBC: 8.3 10*3/uL (ref 3.4–10.8)

## 2018-02-18 LAB — BMP8+EGFR
BUN/Creatinine Ratio: 14 (ref 10–24)
BUN: 20 mg/dL (ref 8–27)
CALCIUM: 9.7 mg/dL (ref 8.6–10.2)
CO2: 22 mmol/L (ref 20–29)
Chloride: 104 mmol/L (ref 96–106)
Creatinine, Ser: 1.42 mg/dL — ABNORMAL HIGH (ref 0.76–1.27)
GFR, EST AFRICAN AMERICAN: 62 mL/min/{1.73_m2} (ref >59)
GFR, EST NON AFRICAN AMERICAN: 53 mL/min/{1.73_m2} — AB (ref >59)
Glucose: 97 mg/dL (ref 65–99)
POTASSIUM: 4.5 mmol/L (ref 3.5–5.2)
Sodium: 142 mmol/L (ref 134–144)

## 2018-02-18 MED ORDER — SILDENAFIL CITRATE 100 MG PO TABS: ORAL_TABLET | ORAL | 11 refills | Status: DC

## 2018-02-18 MED ORDER — KETOCONAZOLE 2 % EX CREA: 1.0000 "application " | TOPICAL_CREAM | Freq: Every day | CUTANEOUS | 2 refills | Status: DC

## 2018-02-18 MED ORDER — TRAZODONE HCL 50 MG PO TABS: 50.0000 mg | ORAL_TABLET | Freq: Every evening | ORAL | 3 refills | Status: DC | PRN

## 2018-02-18 MED ORDER — TESTOSTERONE 10 MG/ACT (2%) TD GEL: TRANSDERMAL | 1 refills | Status: DC

## 2018-02-18 MED ORDER — LISINOPRIL 40 MG PO TABS: 40.0000 mg | ORAL_TABLET | Freq: Every day | ORAL | 3 refills | Status: DC

## 2018-02-18 MED ORDER — OMEPRAZOLE 40 MG PO CPDR: 40.0000 mg | DELAYED_RELEASE_CAPSULE | Freq: Every day | ORAL | 3 refills | Status: DC

## 2018-02-18 NOTE — Progress Notes (Signed)
BP 125/84   Pulse 71   Temp 98.4 F (36.9 C) (Oral)   Ht 5' 6"  (1.676 m)   Wt 198 lb 2 oz (89.9 kg)   BMI 31.98 kg/m    Subjective:    Patient ID: Devin Santiago, male    DOB: February 18, 1956, 62 y.o.   MRN: [id number]  HPI: Devin Santiago is a 62 y.o. male presenting on 02/18/2018 for Medication refills (labwork done at [place] on 3/10; copy under Media tab; gets Testosterone at [facility], all other meds at [facility])   HPI Hypertension Patient is currently on lisinopril, and their blood pressure today is 125/84. Patient denies any lightheadedness or dizziness. Patient denies headaches, blurred vision, chest pains, shortness of breath, or weakness. Denies any side effects from medication and is content with current medication.   GERD Patient is currently on omeprazole.  She denies any major symptoms or abdominal pain or belching or burping. She denies any blood in her stool or lightheadedness or dizziness.   Insomnia Patient says has been having some difficulty falling asleep but not staying asleep and would like to do trazodone after we discussed possible medications.  He says once he is asleep he usually has a good night sleep.  Patient had elevated renal function on his last labs and will recheck today.  Patient is on testosterone for erectile dysfunction and needs a refill of it today.  We will continue to check his blood levels.  He has been doing well on the testosterone.  Relevant past medical, surgical, family and social history reviewed and updated as indicated. Interim medical history since our last visit reviewed. Allergies and medications reviewed and updated.  Review of Systems  Constitutional: Negative for chills and fever.  Respiratory: Negative for shortness of breath and wheezing.   Cardiovascular: Negative for chest pain and leg swelling.  Gastrointestinal: Negative for abdominal pain.  Musculoskeletal: Negative for back pain and gait problem.  Skin: Negative for rash.    Neurological: Negative for dizziness, weakness, light-headedness and headaches.  Psychiatric/Behavioral: Positive for sleep disturbance. Negative for decreased concentration, dysphoric mood, self-injury and suicidal ideas. The patient is not nervous/anxious.   All other systems reviewed and are negative.   Per HPI unless specifically indicated above   Allergies as of 02/18/2018   No Known Allergies     Medication List        Accurate as of 02/18/18  8:41 AM. Always use your most recent med list.          fexofenadine-pseudoephedrine 180-240 MG 24 hr tablet Commonly known as:  ALLEGRA-D 24 Take 1 tablet by mouth daily.   lisinopril 40 MG tablet Commonly known as:  PRINIVIL,ZESTRIL Take 1 tablet (40 mg total) by mouth daily.   omeprazole 40 MG capsule Commonly known as:  PRILOSEC Take 1 capsule (40 mg total) by mouth daily.   sildenafil 100 MG tablet Commonly known as:  VIAGRA TAKE ONE-HALF (1/2) TO ONE TABLET (50 TO 100 MG TOTAL) DAILY AS NEEDED FOR ERECTILE DYSFUNCTION   Testosterone 10 MG/ACT (2%) Gel PLACE 20 MG ONTO THE SKIN ONCE DAILY   traZODone 50 MG tablet Commonly known as:  DESYREL Take 1 tablet (50 mg total) by mouth at bedtime as needed for sleep.          Objective:    BP 125/84   Pulse 71   Temp 98.4 F (36.9 C) (Oral)   Ht 5' 6"  (1.676 m)   Wt 198 lb 2  oz (89.9 kg)   BMI 31.98 kg/m   Wt Readings from Last 3 Encounters:  02/18/18 198 lb 2 oz (89.9 kg)  07/10/17 224 lb (101.6 kg)  03/22/17 215 lb (97.5 kg)    Physical Exam  Constitutional: He is oriented to person, place, and time. He appears well-developed and well-nourished. No distress.  Eyes: Conjunctivae are normal. No scleral icterus.  Neck: Neck supple. No thyromegaly present.  Cardiovascular: Normal rate, regular rhythm, normal heart sounds and intact distal pulses.  No murmur heard. Pulmonary/Chest: Effort normal and breath sounds normal. No respiratory distress. He has no  wheezes.  Musculoskeletal: Normal range of motion. He exhibits no edema.  Lymphadenopathy:    He has no cervical adenopathy.  Neurological: He is alert and oriented to person, place, and time. Coordination normal.  Skin: Skin is warm and dry. No rash noted. He is not diaphoretic.  Psychiatric: He has a normal mood and affect. His behavior is normal.  Nursing note and vitals reviewed.       Assessment & Plan:   Problem List Items Addressed This Visit      Cardiovascular and Mediastinum   Essential hypertension - Primary   Relevant Medications   sildenafil (VIAGRA) 100 MG tablet   lisinopril (PRINIVIL,ZESTRIL) 40 MG tablet     Digestive   GERD (gastroesophageal reflux disease)   Relevant Medications   omeprazole (PRILOSEC) 40 MG capsule     Genitourinary   Erectile dysfunction   Relevant Medications   sildenafil (VIAGRA) 100 MG tablet   Other Relevant Orders   CBC with Differential/Platelet (Completed)   AKI (acute kidney injury) ([place])   Relevant Orders   BMP8+EGFR (Completed)     Other   Testosterone deficiency   Relevant Orders   CBC with Differential/Platelet (Completed)   Insomnia   Relevant Medications   traZODone (DESYREL) 50 MG tablet       Follow up plan: Return in about 6 months (around 08/21/2018), or if symptoms worsen or fail to improve, for Hypertension and testosterone deficiency.  Counseling provided for all of the vaccine components Orders Placed This Encounter  Procedures  . BMP8+EGFR  . CBC with Differential/Platelet    Caryl Pina, MD [facility] 02/18/2018, 8:41 AM

## 2018-02-20 ENCOUNTER — Other Ambulatory Visit: Payer: Self-pay

## 2018-02-20 DIAGNOSIS — N289 Disorder of kidney and ureter, unspecified: Secondary | ICD-10-CM

## 2018-02-22 ENCOUNTER — Ambulatory Visit: Admitting: Family Medicine

## 2018-05-28 ENCOUNTER — Ambulatory Visit (INDEPENDENT_AMBULATORY_CARE_PROVIDER_SITE_OTHER): Admitting: *Deleted

## 2018-05-28 DIAGNOSIS — Z23 Encounter for immunization: Secondary | ICD-10-CM

## 2018-08-08 IMAGING — US US PELVIS LIMITED
1 series · 12 of 12 positions shown · non-contrast
Comparison: None.

CLINICAL DATA: Palpable right lower quadrant soft tissue mass for
2-3 years

EXAM:
LIMITED ULTRASOUND OF PELVIS
TECHNIQUE: Limited transabdominal ultrasound examination of the pelvis was
performed.

[Series 1: us pelvis limited · 0.04mm/px · 12 of 12 slices shown]
[im 1/12]
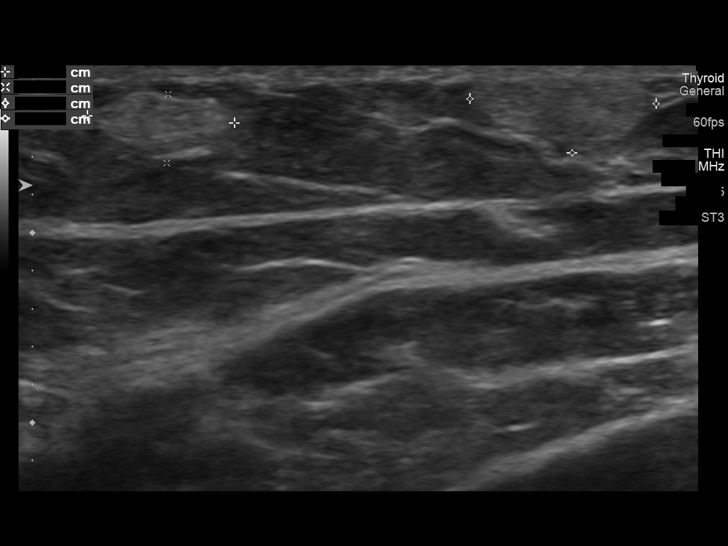
[im 2/12]
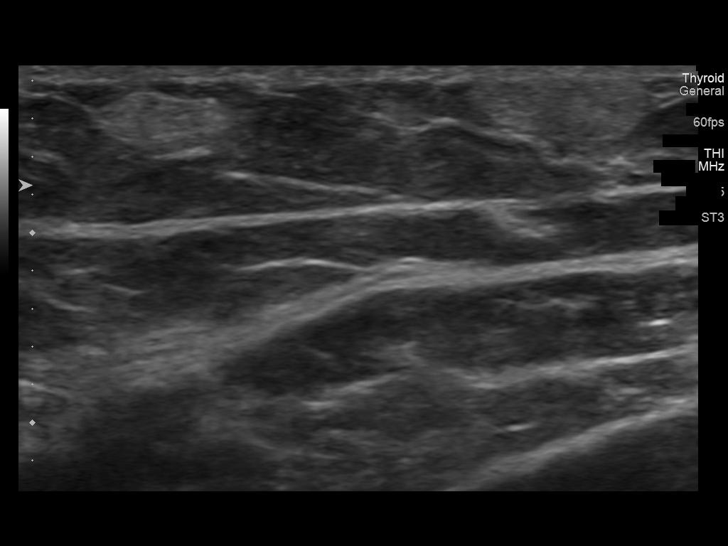
[im 3/12]
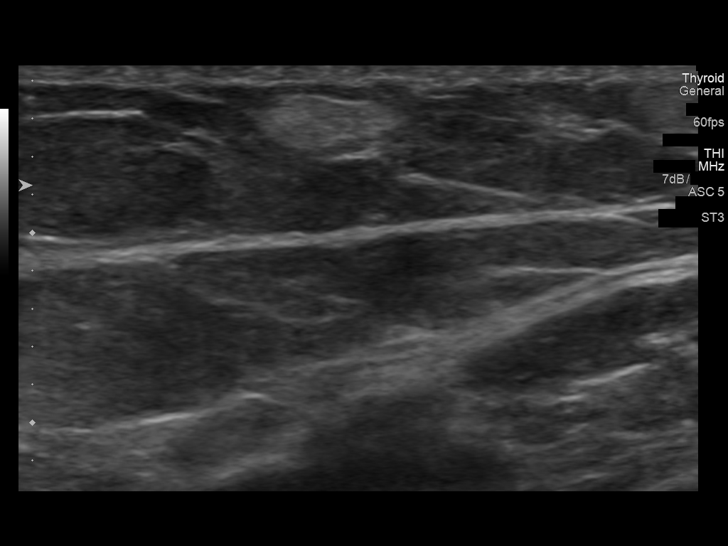
[im 4/12]
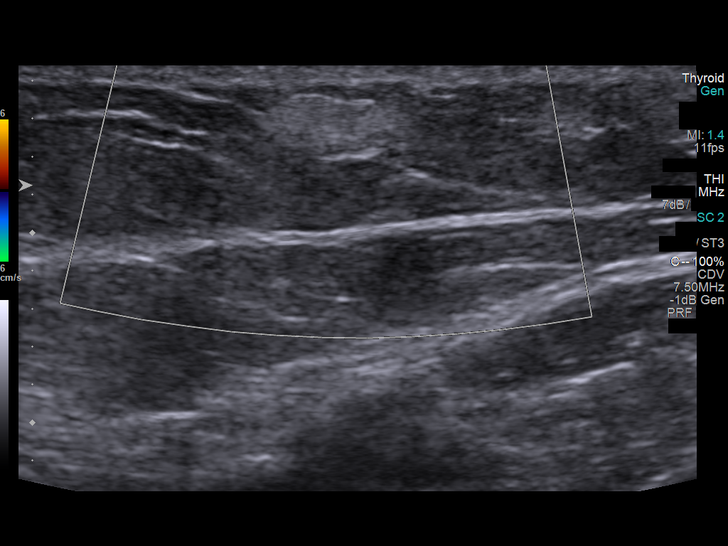
[im 5/12]
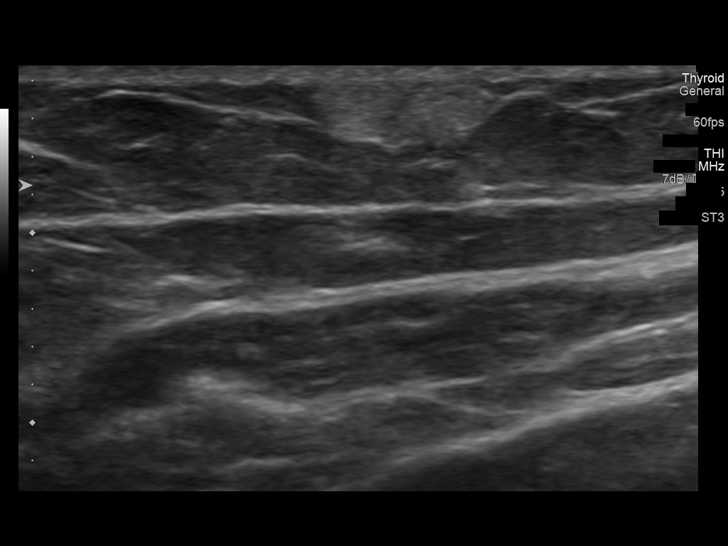
[im 6/12]
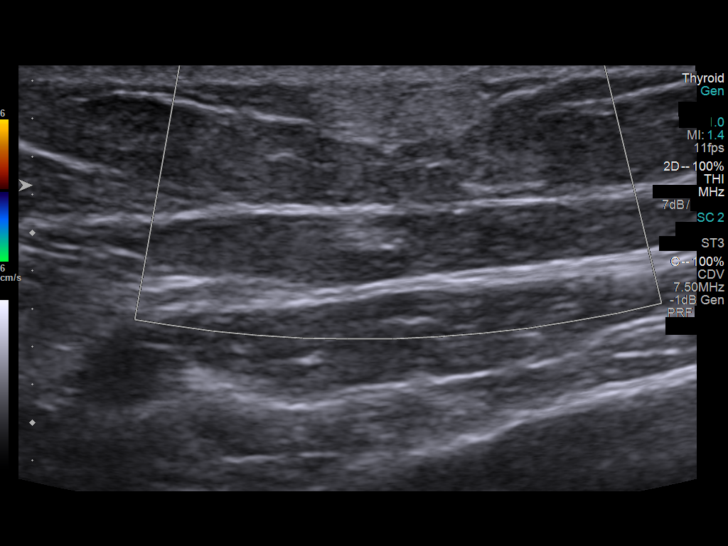
[im 7/12]
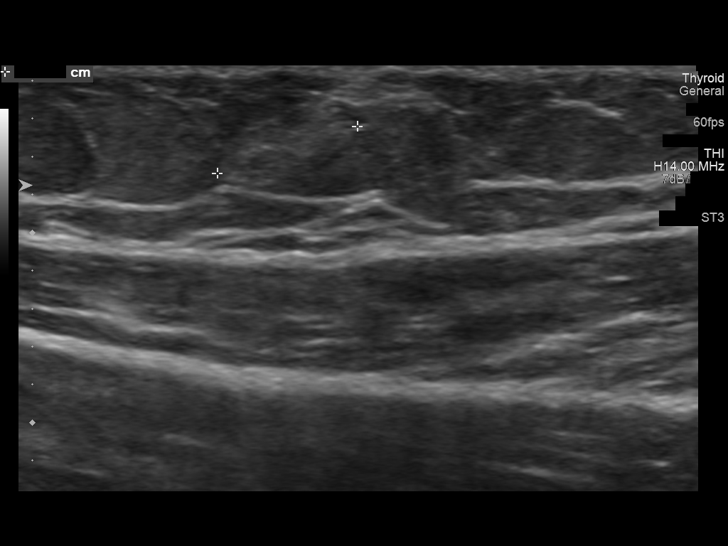
[im 8/12]
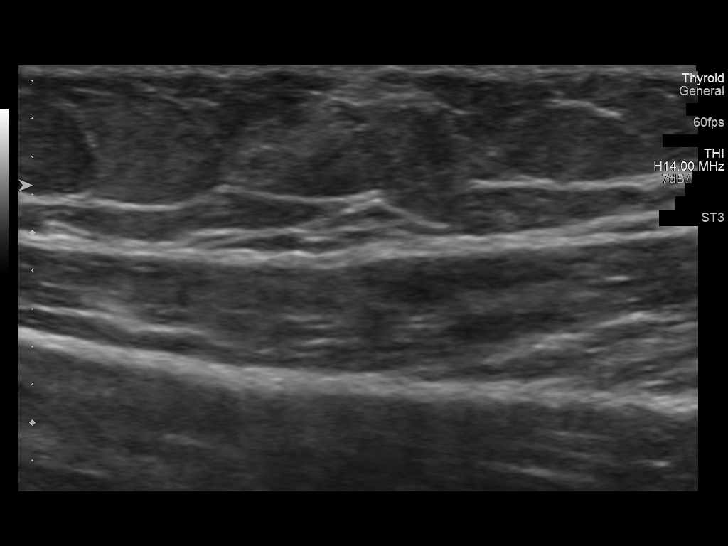
[im 9/12]
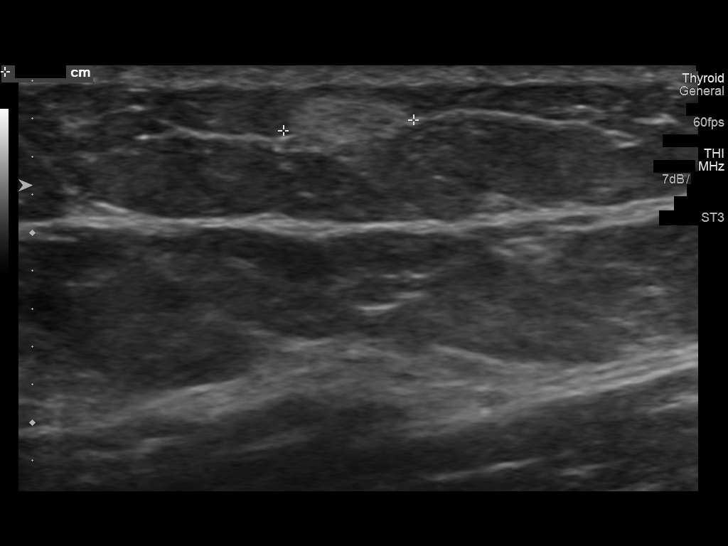
[im 10/12]
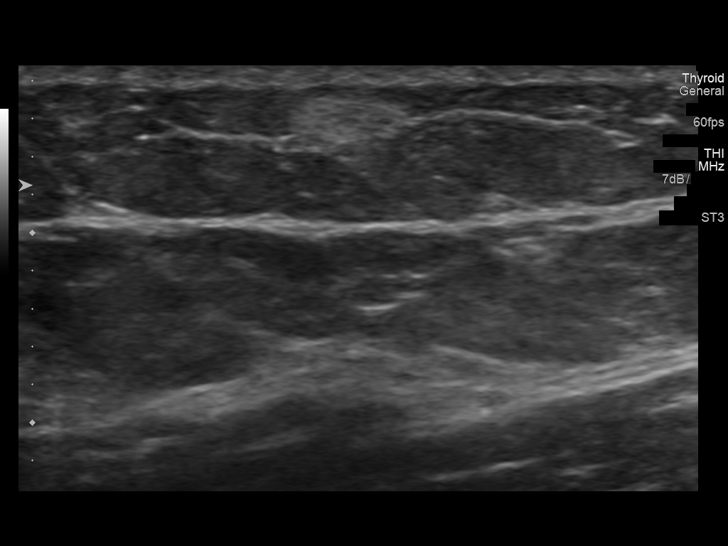
[im 11/12]
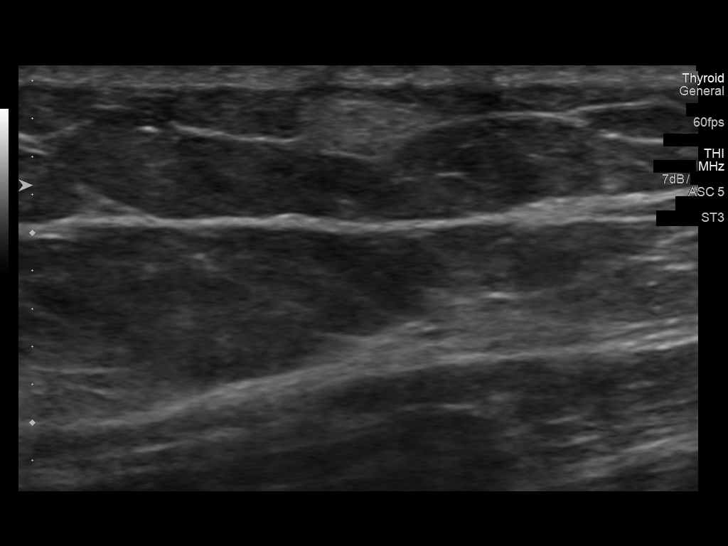
[im 12/12]
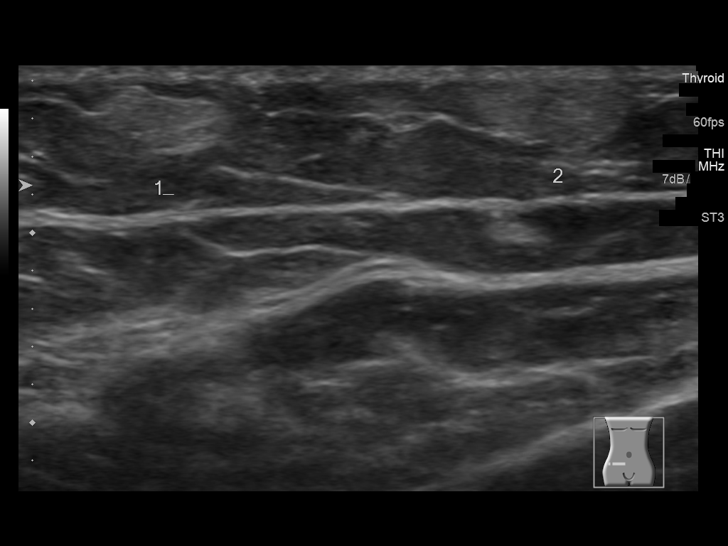

[12 of 12 positions shown; findings below may reference images not displayed]

FINDINGS: There are 2 small hyperechoic nodules in the subcutaneous soft
tissues in the right lower quadrant, measuring 10 x 7 x 5 mm and 8 x
8 x 4 mm. Appearance is most compatible with small lipomas. No
cystic areas. No visible hernia.
IMPRESSION: Small hyperechoic nodules in the right lower quadrant most
compatible with lipomas.

## 2018-08-08 IMAGING — US US ABDOMEN LIMITED
1 series · 10 of 10 positions shown · non-contrast
Comparison: None.

CLINICAL DATA: Abdominal soft tissue mass along the right posterior
flank

EXAM:
LIMITED ABDOMINAL ULTRASOUND

[Series 1: us abdomen limited · 0.06mm/px · 10 of 10 slices shown]
[im 1/10]
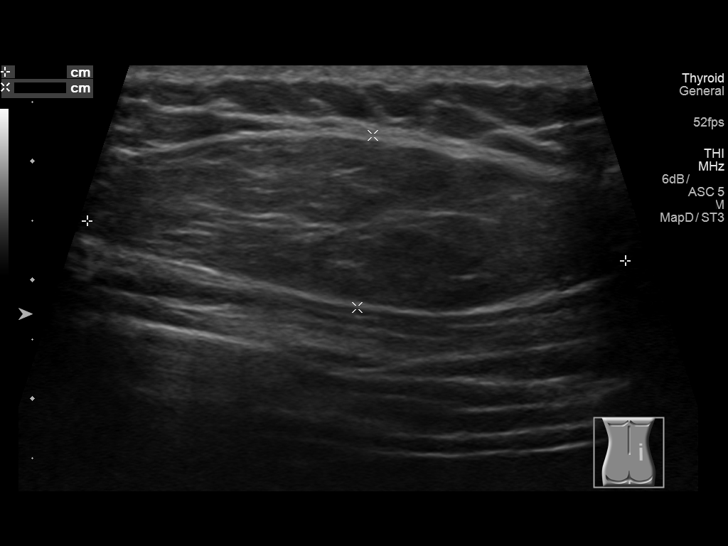
[im 2/10]
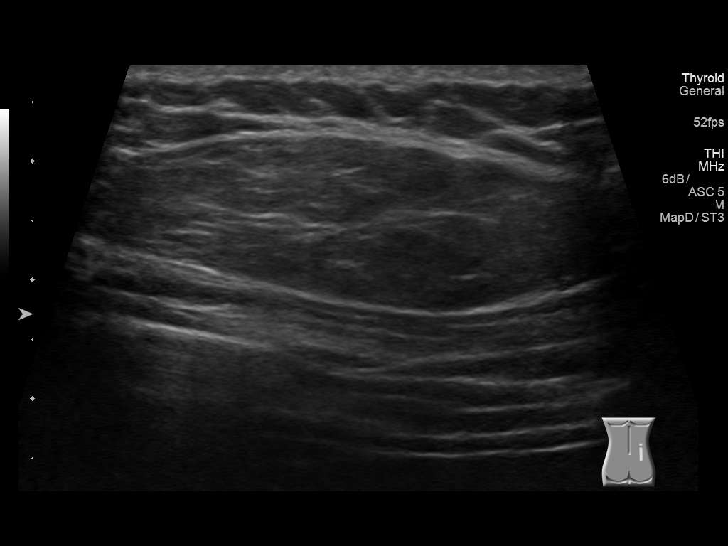
[im 3/10]
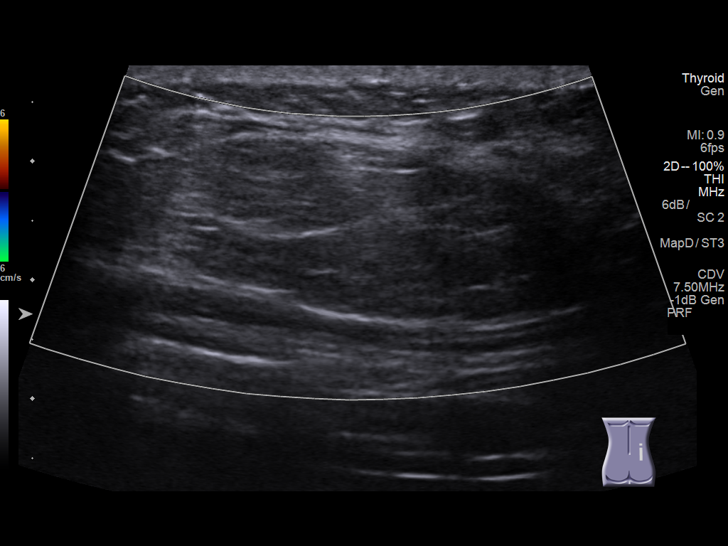
[im 4/10]
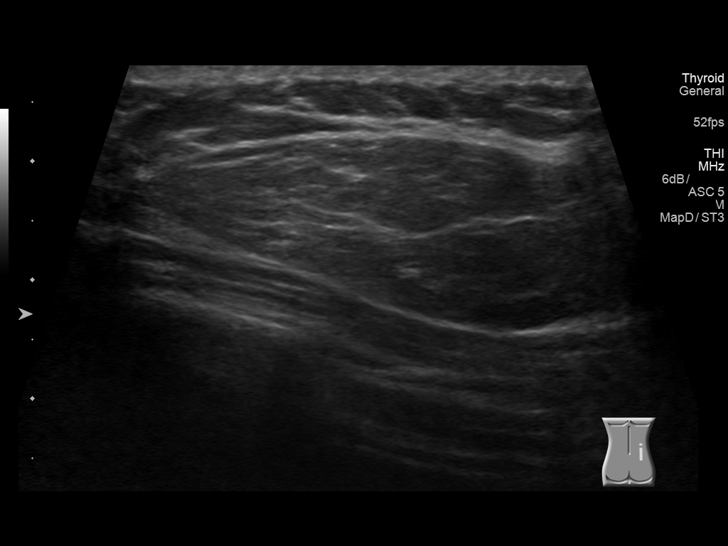
[im 5/10]
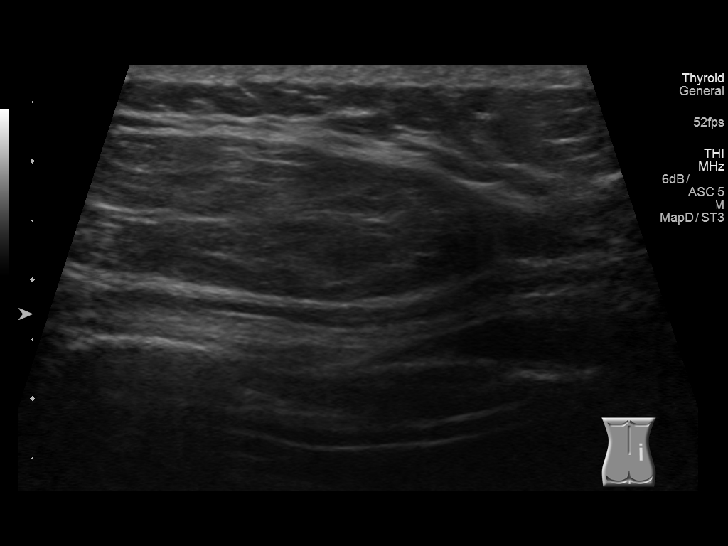
[im 6/10]
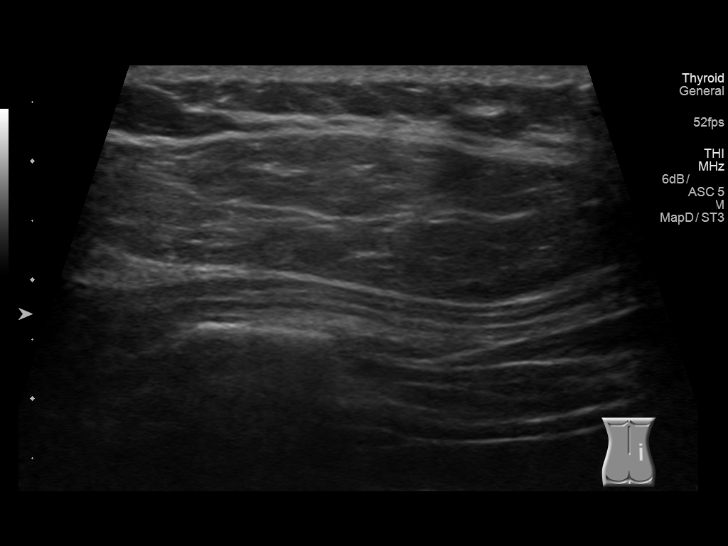
[im 7/10]
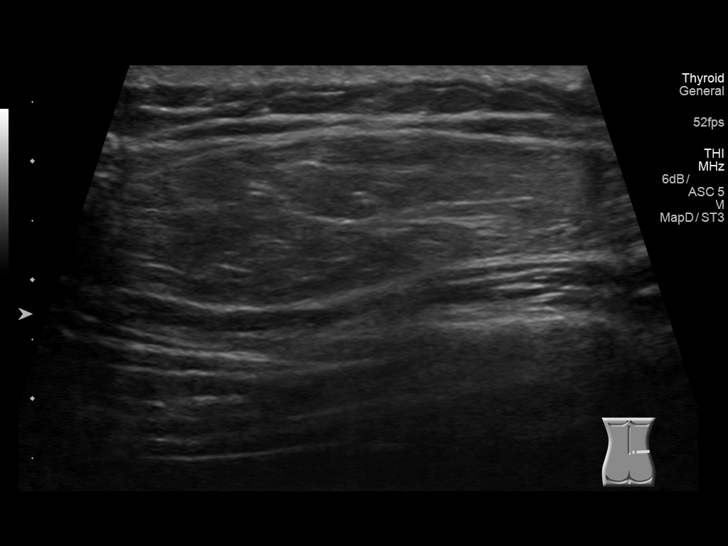
[im 8/10]
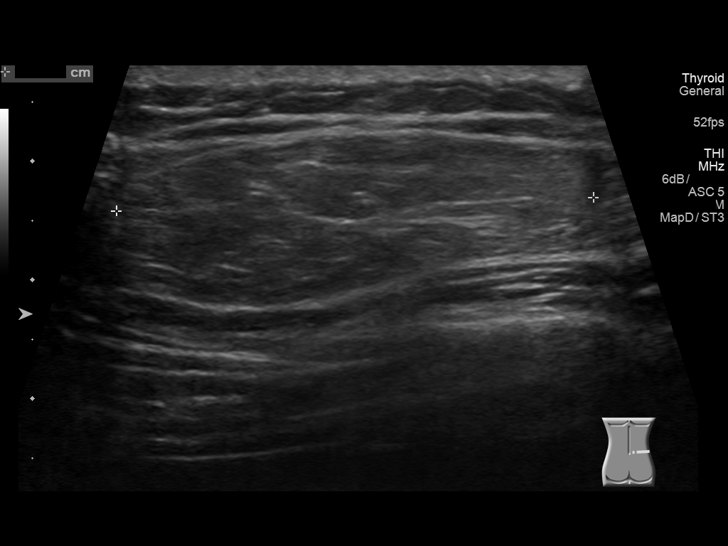
[im 9/10]
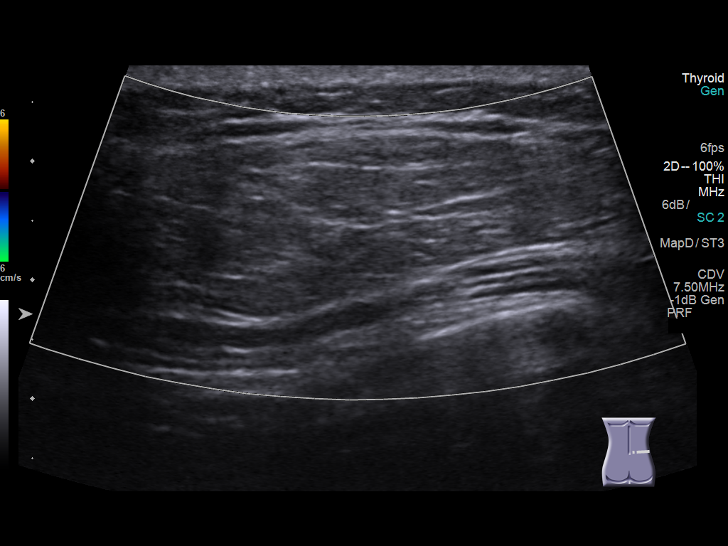
[im 10/10]
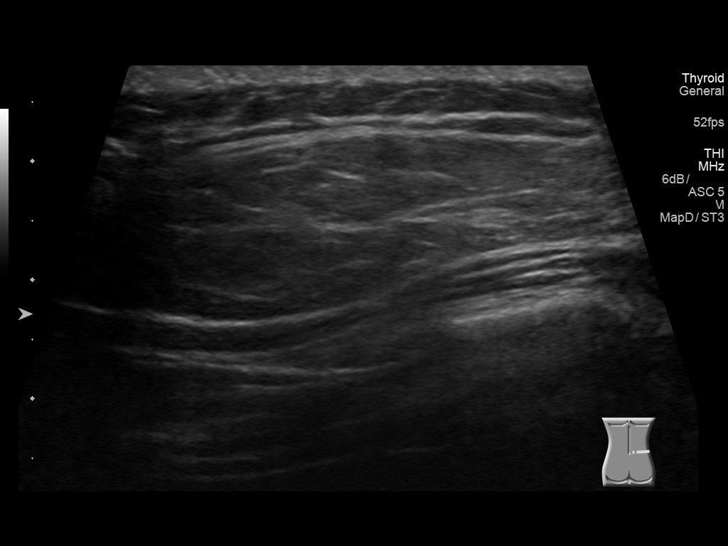

[10 of 10 positions shown; findings below may reference images not displayed]

FINDINGS: High-frequency linear transducer is utilized for evaluation of a
palpable abnormality in the right posterior flank area.

4.6 x 1.5 x 4 cm well-defined hypoechoic mass is identified in the
right flank without internal Doppler flow. The mass is isoechoic to
the adjacent subcutaneous fat.

There is no other solid or cystic mass.
IMPRESSION: 4.6 x 1.5 x 4 cm well-defined mass in the right posterior flank at
the site of clinical palpable abnormality. Mass is isoechoic to the
adjacent subcutaneous fat suggesting a lipoma. If there is further
characterization desired, evaluation with MRI is recommended.

## 2018-08-19 ENCOUNTER — Other Ambulatory Visit: Payer: Self-pay | Admitting: Family Medicine

## 2018-08-30 ENCOUNTER — Encounter: Payer: Self-pay | Admitting: Family Medicine

## 2018-08-30 ENCOUNTER — Ambulatory Visit (INDEPENDENT_AMBULATORY_CARE_PROVIDER_SITE_OTHER): Admitting: Family Medicine

## 2018-08-30 VITALS — BP 161/100 | HR 66 | Temp 97.0°F | Ht 66.0 in | Wt 215.4 lb

## 2018-08-30 DIAGNOSIS — R7989 Other specified abnormal findings of blood chemistry: Secondary | ICD-10-CM

## 2018-08-30 DIAGNOSIS — K219 Gastro-esophageal reflux disease without esophagitis: Secondary | ICD-10-CM

## 2018-08-30 DIAGNOSIS — E349 Endocrine disorder, unspecified: Secondary | ICD-10-CM | POA: Diagnosis not present

## 2018-08-30 DIAGNOSIS — R638 Other symptoms and signs concerning food and fluid intake: Secondary | ICD-10-CM

## 2018-08-30 DIAGNOSIS — I10 Essential (primary) hypertension: Secondary | ICD-10-CM

## 2018-08-30 DIAGNOSIS — G4700 Insomnia, unspecified: Secondary | ICD-10-CM | POA: Diagnosis not present

## 2018-08-30 DIAGNOSIS — N529 Male erectile dysfunction, unspecified: Secondary | ICD-10-CM | POA: Diagnosis not present

## 2018-08-30 MED ORDER — SILDENAFIL CITRATE 100 MG PO TABS: ORAL_TABLET | ORAL | 11 refills | Status: DC

## 2018-08-30 MED ORDER — AMLODIPINE BESYLATE 5 MG PO TABS: 5.0000 mg | ORAL_TABLET | Freq: Every day | ORAL | 3 refills | Status: DC

## 2018-08-30 MED ORDER — TRAZODONE HCL 50 MG PO TABS: 50.0000 mg | ORAL_TABLET | Freq: Every evening | ORAL | 3 refills | Status: DC | PRN

## 2018-08-30 MED ORDER — BUPROPION HCL ER (XL) 150 MG PO TB24: 150.0000 mg | ORAL_TABLET | Freq: Every day | ORAL | 3 refills | Status: DC

## 2018-08-30 MED ORDER — LISINOPRIL 40 MG PO TABS: 40.0000 mg | ORAL_TABLET | Freq: Every day | ORAL | 3 refills | Status: DC

## 2018-08-30 MED ORDER — OMEPRAZOLE 40 MG PO CPDR: 40.0000 mg | DELAYED_RELEASE_CAPSULE | Freq: Every day | ORAL | 3 refills | Status: DC

## 2018-08-30 MED ORDER — TESTOSTERONE 10 MG/ACT (2%) TD GEL: 20.0000 mg | Freq: Every day | TRANSDERMAL | 1 refills | Status: DC

## 2018-08-30 NOTE — Progress Notes (Signed)
BP (!) 161/100   Pulse 66   Temp (!) 97 F (36.1 C) (Oral)   Ht 5' 6"  (1.676 m)   Wt 215 lb 6.4 oz (97.7 kg)   BMI 34.77 kg/m    Subjective:    Patient ID: Devin Santiago, male    DOB: October 25, 1955, 63 y.o.   MRN: [id number]  HPI: Devin Santiago is a 63 y.o. male presenting on 08/30/2018 for Hypertension (6 month follow up. Patient states that he has noticed his upper chest being red at times- is it related to bp?)   HPI Hypertension Patient is currently on lisinopril 40, and their blood pressure today is 161/100 he says sometimes at home it is run in the 130s and sometimes in the 150s.. Patient denies any lightheadedness or dizziness. Patient denies headaches, blurred vision, chest pains, shortness of breath, or weakness. Denies any side effects from medication and is content with current medication.  Said he has some redness on his upper chest when he feels like his blood pressure is up  GERD Patient is currently on omeprazole.  She denies any major symptoms or abdominal pain or belching or burping. She denies any blood in her stool or lightheadedness or dizziness.   Patient needs a refill on his chronic medications including trazodone for insomnia and testosterone for testosterone deficiency.  Patient says is been have a lot of trouble with cravings and gaining weight and he is wondering if there is something that can help with that.  He says that he has tried to make some changes in his lifestyle but he wants to go to vacation this summer and not look as obese as he does currently.  Relevant past medical, surgical, family and social history reviewed and updated as indicated. Interim medical history since our last visit reviewed. Allergies and medications reviewed and updated.  Review of Systems  Constitutional: Negative for chills and fever.  Eyes: Negative for visual disturbance.  Respiratory: Negative for shortness of breath and wheezing.   Cardiovascular: Negative for chest pain  and leg swelling.  Musculoskeletal: Negative for back pain and gait problem.  Skin: Negative for rash.  Neurological: Negative for dizziness, weakness and light-headedness.  All other systems reviewed and are negative.   Per HPI unless specifically indicated above   Allergies as of 08/30/2018   No Known Allergies     Medication List       Accurate as of August 30, 2018 11:11 AM. Always use your most recent med list.        amLODipine 5 MG tablet Commonly known as:  NORVASC Take 1 tablet (5 mg total) by mouth daily.   buPROPion 150 MG 24 hr tablet Commonly known as:  WELLBUTRIN XL Take 1 tablet (150 mg total) by mouth daily.   fexofenadine-pseudoephedrine 180-240 MG 24 hr tablet Commonly known as:  ALLEGRA-D 24 Take 1 tablet by mouth daily.   ketoconazole 2 % cream Commonly known as:  NIZORAL Apply 1 application topically daily.   lisinopril 40 MG tablet Commonly known as:  PRINIVIL,ZESTRIL Take 1 tablet (40 mg total) by mouth daily.   omeprazole 40 MG capsule Commonly known as:  PRILOSEC Take 1 capsule (40 mg total) by mouth daily.   sildenafil 100 MG tablet Commonly known as:  VIAGRA TAKE ONE-HALF (1/2) TO ONE TABLET (50 TO 100 MG TOTAL) DAILY AS NEEDED FOR ERECTILE DYSFUNCTION   Testosterone 10 MG/ACT (2%) Gel Apply 20 mg topically daily.   traZODone 50 MG tablet  Commonly known as:  DESYREL Take 1 tablet (50 mg total) by mouth at bedtime as needed for sleep.          Objective:    BP (!) 161/100   Pulse 66   Temp (!) 97 F (36.1 C) (Oral)   Ht 5' 6"  (1.676 m)   Wt 215 lb 6.4 oz (97.7 kg)   BMI 34.77 kg/m   Wt Readings from Last 3 Encounters:  08/30/18 215 lb 6.4 oz (97.7 kg)  02/18/18 198 lb 2 oz (89.9 kg)  07/10/17 224 lb (101.6 kg)    Physical Exam Vitals signs and nursing note reviewed.  Constitutional:      General: He is not in acute distress.    Appearance: He is well-developed. He is not diaphoretic.  Eyes:     General: No  scleral icterus.    Conjunctiva/sclera: Conjunctivae normal.  Neck:     Musculoskeletal: Neck supple.     Thyroid: No thyromegaly.  Cardiovascular:     Rate and Rhythm: Normal rate and regular rhythm.     Heart sounds: Normal heart sounds. No murmur.  Pulmonary:     Effort: Pulmonary effort is normal. No respiratory distress.     Breath sounds: Normal breath sounds. No wheezing.  Musculoskeletal: Normal range of motion.  Lymphadenopathy:     Cervical: No cervical adenopathy.  Skin:    General: Skin is warm and dry.     Findings: No rash.  Neurological:     Mental Status: He is alert and oriented to person, place, and time.     Coordination: Coordination normal.  Psychiatric:        Behavior: Behavior normal.        Assessment & Plan:   Problem List Items Addressed This Visit      Cardiovascular and Mediastinum   Essential hypertension   Relevant Medications   sildenafil (VIAGRA) 100 MG tablet   lisinopril (PRINIVIL,ZESTRIL) 40 MG tablet   amLODipine (NORVASC) 5 MG tablet   Other Relevant Orders   CMP14+EGFR   Lipid panel     Digestive   GERD (gastroesophageal reflux disease) - Primary   Relevant Medications   omeprazole (PRILOSEC) 40 MG capsule     Other   Erectile dysfunction   Relevant Medications   sildenafil (VIAGRA) 100 MG tablet   Testosterone deficiency   Relevant Medications   Testosterone 10 MG/ACT (2%) GEL   Other Relevant Orders   CBC with Differential/Platelet   Insomnia   Relevant Medications   traZODone (DESYREL) 50 MG tablet    Other Visit Diagnoses    Abnormal craving       Relevant Medications   buPROPion (WELLBUTRIN XL) 150 MG 24 hr tablet      Will start amlodipine because blood pressure is up sometimes down sometimes but is up today.  He is agreeable towards this.  We will continue his other medications as is.  Patient would like to try something for cravings and we talked about possibly Contrave which is non-affordable but will  try Wellbutrin instead.  Follow up plan: Return in about 6 months (around 02/28/2019), or if symptoms worsen or fail to improve, for Hypertension and GERD.  Counseling provided for all of the vaccine components Orders Placed This Encounter  Procedures  . CBC with Differential/Platelet  . CMP14+EGFR  . Lipid panel    Caryl Pina, MD [facility] 08/30/2018, 11:11 AM

## 2018-08-31 LAB — LIPID PANEL
Chol/HDL Ratio: 4.5 ratio (ref 0.0–5.0)
Cholesterol, Total: 118 mg/dL (ref 100–199)
HDL: 26 mg/dL — ABNORMAL LOW (ref >39)
LDL Calculated: 68 mg/dL (ref 0–99)
Triglycerides: 121 mg/dL (ref 0–149)
VLDL Cholesterol Cal: 24 mg/dL (ref 5–40)

## 2018-08-31 LAB — CBC WITH DIFFERENTIAL/PLATELET
Basophils Absolute: 0.1 10*3/uL (ref 0.0–0.2)
Basos: 1 %
EOS (ABSOLUTE): 0.3 10*3/uL (ref 0.0–0.4)
Eos: 4 %
HEMATOCRIT: 55.3 % — AB (ref 37.5–51.0)
Hemoglobin: 18.3 g/dL — ABNORMAL HIGH (ref 13.0–17.7)
Immature Grans (Abs): 0 10*3/uL (ref 0.0–0.1)
Immature Granulocytes: 1 %
Lymphocytes Absolute: 2.3 10*3/uL (ref 0.7–3.1)
Lymphs: 30 %
MCH: 28.1 pg (ref 26.6–33.0)
MCHC: 33.1 g/dL (ref 31.5–35.7)
MCV: 85 fL (ref 79–97)
MONOS ABS: 0.6 10*3/uL (ref 0.1–0.9)
Monocytes: 8 %
Neutrophils Absolute: 4.2 10*3/uL (ref 1.4–7.0)
Neutrophils: 56 %
Platelets: 227 10*3/uL (ref 150–450)
RBC: 6.52 x10E6/uL — AB (ref 4.14–5.80)
RDW: 12.9 % (ref 11.6–15.4)
WBC: 7.6 10*3/uL (ref 3.4–10.8)

## 2018-08-31 LAB — CMP14+EGFR
ALT: 32 IU/L (ref 0–44)
AST: 27 IU/L (ref 0–40)
Albumin/Globulin Ratio: 2.1 (ref 1.2–2.2)
Albumin: 4.8 g/dL (ref 3.6–4.8)
Alkaline Phosphatase: 49 IU/L (ref 39–117)
BUN/Creatinine Ratio: 13 (ref 10–24)
BUN: 19 mg/dL (ref 8–27)
Bilirubin Total: 0.6 mg/dL (ref 0.0–1.2)
CO2: 24 mmol/L (ref 20–29)
Calcium: 9.9 mg/dL (ref 8.6–10.2)
Chloride: 101 mmol/L (ref 96–106)
Creatinine, Ser: 1.47 mg/dL — ABNORMAL HIGH (ref 0.76–1.27)
GFR calc Af Amer: 59 mL/min/{1.73_m2} — ABNORMAL LOW (ref >59)
GFR calc non Af Amer: 51 mL/min/{1.73_m2} — ABNORMAL LOW (ref >59)
Globulin, Total: 2.3 g/dL (ref 1.5–4.5)
Glucose: 88 mg/dL (ref 65–99)
Potassium: 4.5 mmol/L (ref 3.5–5.2)
SODIUM: 144 mmol/L (ref 134–144)
Total Protein: 7.1 g/dL (ref 6.0–8.5)

## 2018-09-02 NOTE — Addendum Note (Signed)
Addended by: Nigel Berthold C on: 09/02/2018 11:54 AM   Modules accepted: Orders

## 2018-11-19 ENCOUNTER — Other Ambulatory Visit: Payer: Self-pay | Admitting: Family Medicine

## 2018-11-19 DIAGNOSIS — E349 Endocrine disorder, unspecified: Secondary | ICD-10-CM

## 2018-11-20 ENCOUNTER — Other Ambulatory Visit: Payer: Self-pay

## 2018-11-20 ENCOUNTER — Encounter: Payer: Self-pay | Admitting: Family Medicine

## 2018-11-20 ENCOUNTER — Ambulatory Visit (INDEPENDENT_AMBULATORY_CARE_PROVIDER_SITE_OTHER): Admitting: Family Medicine

## 2018-11-20 DIAGNOSIS — K219 Gastro-esophageal reflux disease without esophagitis: Secondary | ICD-10-CM | POA: Diagnosis not present

## 2018-11-20 DIAGNOSIS — G4709 Other insomnia: Secondary | ICD-10-CM

## 2018-11-20 DIAGNOSIS — E349 Endocrine disorder, unspecified: Secondary | ICD-10-CM | POA: Diagnosis not present

## 2018-11-20 DIAGNOSIS — I10 Essential (primary) hypertension: Secondary | ICD-10-CM | POA: Diagnosis not present

## 2018-11-20 MED ORDER — TESTOSTERONE 10 MG/ACT (2%) TD GEL: 20.0000 mg | Freq: Every day | TRANSDERMAL | 1 refills | Status: DC

## 2018-11-20 NOTE — Progress Notes (Signed)
Virtual Visit via telephone Note  I connected with Devin Santiago on 11/20/18 at 1423 by telephone and verified that I am speaking with the correct person using two identifiers. Devin Santiago is currently located at home working out and No other people are currently with her during visit. The provider, Fransisca Kaufmann Dewon Mendizabal, MD is located in their office at time of visit.  Call ended at 1434  I discussed the limitations, risks, security and privacy concerns of performing an evaluation and management service by telephone and the availability of in person appointments. I also discussed with the patient that there may be a patient responsible charge related to this service. The patient expressed understanding and agreed to proceed.  History and Present Illness: Losing weight by exercising and increasing physical activity and feeling good and fit  GERD Patient is currently on omeprazole.  She denies any major symptoms or abdominal pain or belching or burping. She denies any blood in her stool or lightheadedness or dizziness.   Hypertension Patient is currently on amlodipine and lisinopril, and their blood pressure today is 130's at home. Patient denies any lightheadedness or dizziness. Patient denies headaches, blurred vision, chest pains, shortness of breath, or weakness. Denies any side effects from medication and is content with current medication.   Insomnia Doing well with trazadone and sleeping well.   Patient is coming in for his testosterone refill, he uses it for testosterone deficiency and has been on the same dose for quite some time and has been working well for him.  He just needs a visit for refill and recheck.  No diagnosis found.  Outpatient Encounter Medications as of 11/20/2018  Medication Sig  . amLODipine (NORVASC) 5 MG tablet Take 1 tablet (5 mg total) by mouth daily.  Marland Kitchen buPROPion (WELLBUTRIN XL) 150 MG 24 hr tablet Take 1 tablet (150 mg total) by mouth daily.  .  fexofenadine-pseudoephedrine (ALLEGRA-D 24) 180-240 MG 24 hr tablet Take 1 tablet by mouth daily.  Marland Kitchen ketoconazole (NIZORAL) 2 % cream Apply 1 application topically daily.  Marland Kitchen lisinopril (PRINIVIL,ZESTRIL) 40 MG tablet Take 1 tablet (40 mg total) by mouth daily.  Marland Kitchen omeprazole (PRILOSEC) 40 MG capsule Take 1 capsule (40 mg total) by mouth daily.  . sildenafil (VIAGRA) 100 MG tablet TAKE ONE-HALF (1/2) TO ONE TABLET (50 TO 100 MG TOTAL) DAILY AS NEEDED FOR ERECTILE DYSFUNCTION  . Testosterone 10 MG/ACT (2%) GEL Apply 20 mg topically daily.  . traZODone (DESYREL) 50 MG tablet Take 1 tablet (50 mg total) by mouth at bedtime as needed for sleep.   No facility-administered encounter medications on file as of 11/20/2018.     Review of Systems  Constitutional: Negative for chills and fever.  Eyes: Negative for visual disturbance.  Respiratory: Negative for chest tightness, shortness of breath and wheezing.   Cardiovascular: Negative for chest pain, palpitations and leg swelling.  Musculoskeletal: Negative for back pain and gait problem.  Skin: Negative for rash.  All other systems reviewed and are negative.   Observations/Objective: Patient sounds comfortable on the phone and in no acute distress  Assessment and Plan: Problem List Items Addressed This Visit      Cardiovascular and Mediastinum   Essential hypertension     Digestive   GERD (gastroesophageal reflux disease)     Other   Testosterone deficiency - Primary   Relevant Medications   Testosterone 10 MG/ACT (2%) GEL   Insomnia       Follow Up Instructions:  Follow-up in  6 months for recheck and blood work   I discussed the assessment and treatment plan with the patient. The patient was provided an opportunity to ask questions and all were answered. The patient agreed with the plan and demonstrated an understanding of the instructions.   The patient was advised to call back or seek an in-person evaluation if the symptoms  worsen or if the condition fails to improve as anticipated.  The above assessment and management plan was discussed with the patient. The patient verbalized understanding of and has agreed to the management plan. Patient is aware to call the clinic if symptoms persist or worsen. Patient is aware when to return to the clinic for a follow-up visit. Patient educated on when it is appropriate to go to the emergency department.    I provided 11 minutes of non-face-to-face time during this encounter.    Worthy Rancher, MD

## 2018-11-20 NOTE — Telephone Encounter (Signed)
Left message , please call [facility] to set up a phone call with Dr. Warrick Parisian to discuss health and medications.

## 2018-11-20 NOTE — Telephone Encounter (Signed)
Patient needs to be set up for a telephone visit because his controlled substance

## 2018-11-29 NOTE — Telephone Encounter (Signed)
Left message for patient.  Did you pick up script for testosterone?  Was a refill sent in error?

## 2018-11-29 NOTE — Telephone Encounter (Signed)
It I just did a visit with him on the eighth and refill this, why is it asking for another refill

## 2019-02-26 ENCOUNTER — Other Ambulatory Visit: Payer: Self-pay

## 2019-02-28 ENCOUNTER — Ambulatory Visit (INDEPENDENT_AMBULATORY_CARE_PROVIDER_SITE_OTHER): Admitting: Family Medicine

## 2019-02-28 ENCOUNTER — Other Ambulatory Visit: Payer: Self-pay

## 2019-02-28 ENCOUNTER — Encounter: Payer: Self-pay | Admitting: Family Medicine

## 2019-02-28 VITALS — BP 139/81 | HR 67 | Temp 98.0°F | Ht 66.0 in | Wt 199.4 lb

## 2019-02-28 DIAGNOSIS — Z13220 Encounter for screening for lipoid disorders: Secondary | ICD-10-CM

## 2019-02-28 DIAGNOSIS — Z6832 Body mass index (BMI) 32.0-32.9, adult: Secondary | ICD-10-CM

## 2019-02-28 DIAGNOSIS — Z7689 Persons encountering health services in other specified circumstances: Secondary | ICD-10-CM

## 2019-02-28 DIAGNOSIS — I10 Essential (primary) hypertension: Secondary | ICD-10-CM | POA: Diagnosis not present

## 2019-02-28 DIAGNOSIS — K219 Gastro-esophageal reflux disease without esophagitis: Secondary | ICD-10-CM

## 2019-02-28 DIAGNOSIS — E349 Endocrine disorder, unspecified: Secondary | ICD-10-CM | POA: Diagnosis not present

## 2019-02-28 MED ORDER — PHENTERMINE HCL 37.5 MG PO CAPS: 37.5000 mg | ORAL_CAPSULE | ORAL | 0 refills | Status: DC

## 2019-02-28 NOTE — Progress Notes (Signed)
BP 139/81   Pulse 67   Temp 98 F (36.7 C) (Oral)   Ht _0  (1.676 m)   Wt 199 lb 6.4 oz (90.4 kg)   BMI 32.18 kg/m    Subjective:   Patient ID: Devin Santiago, male    DOB: 1956-05-27, 63 y.o.   MRN: [id number]  HPI: Devin Santiago is a 63 y.o. male presenting on 02/28/2019 for Hypertension (6 month followup)   HPI Hypertension Patient is currently on amlodipine and lisinopril, and their blood pressure today is 139/81. Patient denies any lightheadedness or dizziness. Patient denies headaches, blurred vision, chest pains, shortness of breath, or weakness. Denies any side effects from medication and is content with current medication.   GERD Patient is currently on omeprazole.  She denies any major symptoms or abdominal pain or belching or burping. She denies any blood in her stool or lightheadedness or dizziness.   Testosterone deficiency recheck Patient is coming in today for testosterone deficiency recheck.  He is currently taking testosterone supplementation via testosterone gel and it is coming in for recheck today.  His blood counts have been higher and we are monitoring him closely for hemoconcentration.  Patient is also coming in for weight management and obesity.  He has been gaining some weight recently and was prescribed phentermine from the [place] system and wants to know if he can continue it for few months.  We did discuss the risk factors of the phentermine and benefits and also about lifestyle modification which she is going to increase his exercise and get back to where he was previously.  Relevant past medical, surgical, family and social history reviewed and updated as indicated. Interim medical history since our last visit reviewed. Allergies and medications reviewed and updated.  Review of Systems  Constitutional: Negative for chills and fever.  Respiratory: Negative for shortness of breath and wheezing.   Cardiovascular: Negative for chest pain and leg swelling.   Musculoskeletal: Negative for back pain and gait problem.  Skin: Negative for rash.  Neurological: Negative for dizziness, weakness and light-headedness.  All other systems reviewed and are negative.   Per HPI unless specifically indicated above   Allergies as of 02/28/2019   No Known Allergies     Medication List       Accurate as of February 28, 2019  8:50 AM. If you have any questions, ask your nurse or doctor.        amLODipine 5 MG tablet Commonly known as: NORVASC Take 1 tablet (5 mg total) by mouth daily.   fexofenadine-pseudoephedrine 180-240 MG 24 hr tablet Commonly known as: ALLEGRA-D 24 Take 1 tablet by mouth daily.   ketoconazole 2 % cream Commonly known as: NIZORAL Apply 1 application topically daily.   lisinopril 40 MG tablet Commonly known as: ZESTRIL Take 1 tablet (40 mg total) by mouth daily.   omeprazole 40 MG capsule Commonly known as: PRILOSEC Take 1 capsule (40 mg total) by mouth daily.   phentermine 37.5 MG capsule Take 1 capsule (37.5 mg total) by mouth every morning. Started by: Fransisca Kaufmann Aarohi Redditt, MD   sildenafil 100 MG tablet Commonly known as: Viagra TAKE ONE-HALF (1/2) TO ONE TABLET (50 TO 100 MG TOTAL) DAILY AS NEEDED FOR ERECTILE DYSFUNCTION   Testosterone 10 MG/ACT (2%) Gel Apply 20 mg topically daily.   traZODone 50 MG tablet Commonly known as: DESYREL Take 1 tablet (50 mg total) by mouth at bedtime as needed for sleep.  Objective:   BP 139/81   Pulse 67   Temp 98 F (36.7 C) (Oral)   Ht _0  (1.676 m)   Wt 199 lb 6.4 oz (90.4 kg)   BMI 32.18 kg/m   Wt Readings from Last 3 Encounters:  02/28/19 199 lb 6.4 oz (90.4 kg)  08/30/18 215 lb 6.4 oz (97.7 kg)  02/18/18 198 lb 2 oz (89.9 kg)    Physical Exam Vitals signs and nursing note reviewed.  Constitutional:      General: He is not in acute distress.    Appearance: He is well-developed. He is not diaphoretic.  Eyes:     General: No scleral icterus.     Conjunctiva/sclera: Conjunctivae normal.  Neck:     Musculoskeletal: Neck supple.     Thyroid: No thyromegaly.  Cardiovascular:     Rate and Rhythm: Normal rate and regular rhythm.     Heart sounds: Normal heart sounds. No murmur.  Pulmonary:     Effort: Pulmonary effort is normal. No respiratory distress.     Breath sounds: Normal breath sounds. No wheezing.  Musculoskeletal: Normal range of motion.  Lymphadenopathy:     Cervical: No cervical adenopathy.  Skin:    General: Skin is warm and dry.     Findings: No rash.  Neurological:     Mental Status: He is alert and oriented to person, place, and time.     Coordination: Coordination normal.  Psychiatric:        Behavior: Behavior normal.       Assessment & Plan:   Problem List Items Addressed This Visit      Cardiovascular and Mediastinum   Essential hypertension - Primary   Relevant Orders   CMP14+EGFR (Completed)     Digestive   GERD (gastroesophageal reflux disease)   Relevant Orders   CBC with Differential/Platelet (Completed)   CMP14+EGFR (Completed)     Other   Testosterone deficiency   Relevant Orders   Testosterone,Free and Total (Completed)   BMI 32.0-32.9,adult    Other Visit Diagnoses    Lipid screening       Relevant Orders   Lipid panel (Completed)   Encounter for weight management          Continue testosterone and continue blood pressure medication and acid reflux medication, patient is not having any issues currently.  Patient started phentermine at outside clinic, discussed doing short-term phentermine Follow up plan: Return in about 4 weeks (around 03/28/2019), or if symptoms worsen or fail to improve, for Obesity recheck.  Counseling provided for all of the vaccine components Orders Placed This Encounter  Procedures  . CBC with Differential/Platelet  . CMP14+EGFR  . Lipid panel  . Testosterone,Free and Total    Caryl Pina, MD [facility] 02/28/2019,  8:50 AM

## 2019-03-01 LAB — CMP14+EGFR
ALT: 26 IU/L (ref 0–44)
AST: 30 IU/L (ref 0–40)
Albumin/Globulin Ratio: 2.4 — ABNORMAL HIGH (ref 1.2–2.2)
Albumin: 4.8 g/dL (ref 3.8–4.8)
Alkaline Phosphatase: 55 IU/L (ref 39–117)
BUN/Creatinine Ratio: 20 (ref 10–24)
BUN: 24 mg/dL (ref 8–27)
Bilirubin Total: 0.8 mg/dL (ref 0.0–1.2)
CO2: 23 mmol/L (ref 20–29)
Calcium: 9.4 mg/dL (ref 8.6–10.2)
Chloride: 101 mmol/L (ref 96–106)
Creatinine, Ser: 1.2 mg/dL (ref 0.76–1.27)
GFR calc Af Amer: 75 mL/min/{1.73_m2} (ref >59)
GFR calc non Af Amer: 65 mL/min/{1.73_m2} (ref >59)
Globulin, Total: 2 g/dL (ref 1.5–4.5)
Glucose: 99 mg/dL (ref 65–99)
Potassium: 4.5 mmol/L (ref 3.5–5.2)
Sodium: 139 mmol/L (ref 134–144)
Total Protein: 6.8 g/dL (ref 6.0–8.5)

## 2019-03-01 LAB — LIPID PANEL
Chol/HDL Ratio: 4 ratio (ref 0.0–5.0)
Cholesterol, Total: 153 mg/dL (ref 100–199)
HDL: 38 mg/dL — ABNORMAL LOW (ref >39)
LDL Calculated: 91 mg/dL (ref 0–99)
Triglycerides: 118 mg/dL (ref 0–149)
VLDL Cholesterol Cal: 24 mg/dL (ref 5–40)

## 2019-03-01 LAB — TESTOSTERONE,FREE AND TOTAL
Testosterone, Free: 3.2 pg/mL — ABNORMAL LOW (ref 6.6–18.1)
Testosterone: 157 ng/dL — ABNORMAL LOW (ref 264–916)

## 2019-03-01 LAB — CBC WITH DIFFERENTIAL/PLATELET
Basophils Absolute: 0.1 10*3/uL (ref 0.0–0.2)
Basos: 1 %
EOS (ABSOLUTE): 0.3 10*3/uL (ref 0.0–0.4)
Eos: 4 %
Hematocrit: 50 % (ref 37.5–51.0)
Hemoglobin: 16.6 g/dL (ref 13.0–17.7)
Immature Grans (Abs): 0 10*3/uL (ref 0.0–0.1)
Immature Granulocytes: 1 %
Lymphocytes Absolute: 2.1 10*3/uL (ref 0.7–3.1)
Lymphs: 25 %
MCH: 29.1 pg (ref 26.6–33.0)
MCHC: 33.2 g/dL (ref 31.5–35.7)
MCV: 88 fL (ref 79–97)
Monocytes Absolute: 0.7 10*3/uL (ref 0.1–0.9)
Monocytes: 8 %
Neutrophils Absolute: 5.1 10*3/uL (ref 1.4–7.0)
Neutrophils: 61 %
Platelets: 235 10*3/uL (ref 150–450)
RBC: 5.71 x10E6/uL (ref 4.14–5.80)
RDW: 12.7 % (ref 11.6–15.4)
WBC: 8.2 10*3/uL (ref 3.4–10.8)

## 2019-04-01 ENCOUNTER — Ambulatory Visit: Admitting: *Deleted

## 2019-04-01 ENCOUNTER — Other Ambulatory Visit: Payer: Self-pay

## 2019-04-01 VITALS — Wt 191.2 lb

## 2019-04-01 DIAGNOSIS — Z7689 Persons encountering health services in other specified circumstances: Secondary | ICD-10-CM

## 2019-04-01 NOTE — Progress Notes (Signed)
Pt here for weight check Wt 191.2lb

## 2019-04-04 ENCOUNTER — Telehealth: Payer: Self-pay | Admitting: Family Medicine

## 2019-04-04 NOTE — Telephone Encounter (Signed)
What is the name of the medication? phentermine 37.5 MG capsule    Have you contacted your pharmacy to request a refill? Yes  Which pharmacy would you like this sent to? [facility] [place] he came to office to weigh in  On 04/01/2019 with triage nurse  Patient notified that their request is being sent to the clinical staff for review and that they should receive a call once it is complete. If they do not receive a call within 24 hours they can check with their pharmacy or our office.

## 2019-04-07 NOTE — Telephone Encounter (Signed)
Apt scheduled.  

## 2019-04-07 NOTE — Telephone Encounter (Signed)
Needs to be filled in a visit, can be virtual, I have some openings this week on virtual.

## 2019-04-09 ENCOUNTER — Ambulatory Visit (INDEPENDENT_AMBULATORY_CARE_PROVIDER_SITE_OTHER): Admitting: Family Medicine

## 2019-04-09 ENCOUNTER — Encounter: Payer: Self-pay | Admitting: Family Medicine

## 2019-04-09 VITALS — Wt 196.0 lb

## 2019-04-09 DIAGNOSIS — Z7689 Persons encountering health services in other specified circumstances: Secondary | ICD-10-CM

## 2019-04-09 MED ORDER — PHENTERMINE HCL 37.5 MG PO CAPS: 37.5000 mg | ORAL_CAPSULE | ORAL | 1 refills | Status: DC

## 2019-04-09 NOTE — Progress Notes (Signed)
Virtual Visit via telephone Note  I connected with Devin Santiago on 04/09/19 at 1412 by telephone and verified that I am speaking with the correct person using two identifiers. [name] is currently located at home and no other people are currently with her during visit. The provider, Fransisca Kaufmann Dian Laprade, MD is located in their office at time of visit.  Call ended at 1426  I discussed the limitations, risks, security and privacy concerns of performing an evaluation and management service by telephone and the availability of in person appointments. I also discussed with the patient that there may be a patient responsible charge related to this service. The patient expressed understanding and agreed to proceed.   History and Present Illness: Weight management Patient is calling in for recheck for phentermine and weight recheck and is currently taking phentermine. He is trying to reduce carbs and portion sizes.  He is working on dietary and lifestyle modifications.   No diagnosis found.  Outpatient Encounter Medications as of 04/09/2019  Medication Sig  . amLODipine (NORVASC) 5 MG tablet Take 1 tablet (5 mg total) by mouth daily.  . fexofenadine-pseudoephedrine (ALLEGRA-D 24) 180-240 MG 24 hr tablet Take 1 tablet by mouth daily.  Marland Kitchen ketoconazole (NIZORAL) 2 % cream Apply 1 application topically daily.  Marland Kitchen lisinopril (PRINIVIL,ZESTRIL) 40 MG tablet Take 1 tablet (40 mg total) by mouth daily.  Marland Kitchen omeprazole (PRILOSEC) 40 MG capsule Take 1 capsule (40 mg total) by mouth daily.  . phentermine 37.5 MG capsule Take 1 capsule (37.5 mg total) by mouth every morning.  . sildenafil (VIAGRA) 100 MG tablet TAKE ONE-HALF (1/2) TO ONE TABLET (50 TO 100 MG TOTAL) DAILY AS NEEDED FOR ERECTILE DYSFUNCTION  . Testosterone 10 MG/ACT (2%) GEL Apply 20 mg topically daily.  . traZODone (DESYREL) 50 MG tablet Take 1 tablet (50 mg total) by mouth at bedtime as needed for sleep.   No facility-administered encounter  medications on file as of 04/09/2019.     Review of Systems  Constitutional: Negative for chills, fatigue and fever.  Respiratory: Negative for shortness of breath and wheezing.   Cardiovascular: Negative for chest pain and leg swelling.  Musculoskeletal: Negative for back pain and gait problem.  Skin: Negative for rash.  All other systems reviewed and are negative.   Observations/Objective: Patient sounds comfortable and in no acute distress  Assessment and Plan: Problem List Items Addressed This Visit    None    Visit Diagnoses    Encounter for weight management    -  Primary   Relevant Medications   phentermine 37.5 MG capsule       Follow Up Instructions: 2 month weight recheck    I discussed the assessment and treatment plan with the patient. The patient was provided an opportunity to ask questions and all were answered. The patient agreed with the plan and demonstrated an understanding of the instructions.   The patient was advised to call back or seek an in-person evaluation if the symptoms worsen or if the condition fails to improve as anticipated.  The above assessment and management plan was discussed with the patient. The patient verbalized understanding of and has agreed to the management plan. Patient is aware to call the clinic if symptoms persist or worsen. Patient is aware when to return to the clinic for a follow-up visit. Patient educated on when it is appropriate to go to the emergency department.    I provided 14 minutes of non-face-to-face time during this  encounter.    Worthy Rancher, MD

## 2019-06-03 ENCOUNTER — Other Ambulatory Visit: Payer: Self-pay

## 2019-06-04 ENCOUNTER — Other Ambulatory Visit: Payer: Self-pay

## 2019-06-04 ENCOUNTER — Encounter: Payer: Self-pay | Admitting: Family Medicine

## 2019-06-04 ENCOUNTER — Ambulatory Visit (INDEPENDENT_AMBULATORY_CARE_PROVIDER_SITE_OTHER): Admitting: Family Medicine

## 2019-06-04 VITALS — BP 124/83 | HR 91 | Temp 95.9°F | Ht 66.0 in | Wt 198.8 lb

## 2019-06-04 DIAGNOSIS — S39012A Strain of muscle, fascia and tendon of lower back, initial encounter: Secondary | ICD-10-CM | POA: Diagnosis not present

## 2019-06-04 DIAGNOSIS — Z7689 Persons encountering health services in other specified circumstances: Secondary | ICD-10-CM

## 2019-06-04 DIAGNOSIS — Z23 Encounter for immunization: Secondary | ICD-10-CM | POA: Diagnosis not present

## 2019-06-04 DIAGNOSIS — E349 Endocrine disorder, unspecified: Secondary | ICD-10-CM

## 2019-06-04 DIAGNOSIS — E669 Obesity, unspecified: Secondary | ICD-10-CM | POA: Diagnosis not present

## 2019-06-04 MED ORDER — TESTOSTERONE 10 MG/ACT (2%) TD GEL: 20.0000 mg | Freq: Every day | TRANSDERMAL | 1 refills | Status: DC

## 2019-06-04 MED ORDER — CYCLOBENZAPRINE HCL 10 MG PO TABS: 10.0000 mg | ORAL_TABLET | Freq: Every evening | ORAL | 1 refills | Status: DC | PRN

## 2019-06-04 MED ORDER — PHENTERMINE HCL 37.5 MG PO CAPS: 37.5000 mg | ORAL_CAPSULE | ORAL | 1 refills | Status: DC

## 2019-06-04 NOTE — Progress Notes (Signed)
BP 124/83   Pulse 91   Temp (!) 95.9 F (35.5 C) (Temporal)   Ht 5\' 6"  (1.676 m)   Wt 198 lb 12.8 oz (90.2 kg)   SpO2 97%   BMI 32.09 kg/m    Subjective:   Patient ID: Devin Santiago, male    DOB: 13-Aug-1956, 63 y.o.   MRN: [id number]  HPI: Devin Santiago is a 63 y.o. male presenting on 06/04/2019 for Weight Check   HPI Patient is coming in complaining of low back strain that is been bothering him over the past week or 2.  He does not note specific any incident where he bothered it but is just been mildly bothersome in that right lower back and it hurts with certain motions and is stiffer in the morning and loosens up as the day progresses.  He has not been using anything over-the-counter for this yet and was coming in to see what he could use for it.  He denies any numbness or weakness or pain shooting anywhere else.  Patient is coming in for recheck of testosterone deficiency and a refill for his medications.  He uses the gel and it seems to be working pretty good for him.  Patient is coming in for obesity and weight recheck.  He is up just slightly about 4 pounds on his weight has been taking the phentermine although he had been out of it for a short period of time.  He is keeping close eye on his blood pressures been running well.  He does keep active but he will refocus on his diet a little bit more    Relevant past medical, surgical, family and social history reviewed and updated as indicated. Interim medical history since our last visit reviewed. Allergies and medications reviewed and updated.  Review of Systems  Constitutional: Negative for chills and fever.  Eyes: Negative for visual disturbance.  Respiratory: Negative for shortness of breath and wheezing.   Cardiovascular: Negative for chest pain and leg swelling.  Musculoskeletal: Positive for back pain. Negative for gait problem.  Skin: Negative for rash.  Neurological: Negative for dizziness, weakness and  light-headedness.  All other systems reviewed and are negative.   Per HPI unless specifically indicated above   Allergies as of 06/04/2019   No Known Allergies     Medication List       Accurate as of June 04, 2019 11:36 AM. If you have any questions, ask your nurse or doctor.        amLODipine 5 MG tablet Commonly known as: NORVASC Take 1 tablet (5 mg total) by mouth daily.   cyclobenzaprine 10 MG tablet Commonly known as: FLEXERIL Take 1 tablet (10 mg total) by mouth at bedtime as needed for muscle spasms. Started by: Fransisca Kaufmann Joss Mcdill, MD   fexofenadine-pseudoephedrine 180-240 MG 24 hr tablet Commonly known as: ALLEGRA-D 24 Take 1 tablet by mouth daily.   ketoconazole 2 % cream Commonly known as: NIZORAL Apply 1 application topically daily.   lisinopril 40 MG tablet Commonly known as: ZESTRIL Take 1 tablet (40 mg total) by mouth daily.   omeprazole 40 MG capsule Commonly known as: PRILOSEC Take 1 capsule (40 mg total) by mouth daily.   phentermine 37.5 MG capsule Take 1 capsule (37.5 mg total) by mouth every morning.   sildenafil 100 MG tablet Commonly known as: Viagra TAKE ONE-HALF (1/2) TO ONE TABLET (50 TO 100 MG TOTAL) DAILY AS NEEDED FOR ERECTILE DYSFUNCTION   Testosterone 10 MG/ACT (  2%) Gel Apply 20 mg topically daily.   traZODone 50 MG tablet Commonly known as: DESYREL Take 1 tablet (50 mg total) by mouth at bedtime as needed for sleep.        Objective:   BP 124/83   Pulse 91   Temp (!) 95.9 F (35.5 C) (Temporal)   Ht 5\' 6"  (1.676 m)   Wt 198 lb 12.8 oz (90.2 kg)   SpO2 97%   BMI 32.09 kg/m   Wt Readings from Last 3 Encounters:  06/04/19 198 lb 12.8 oz (90.2 kg)  04/09/19 196 lb (88.9 kg)  04/01/19 191 lb 3.2 oz (86.7 kg)    Physical Exam Vitals signs and nursing note reviewed.  Constitutional:      General: He is not in acute distress.    Appearance: He is well-developed. He is not diaphoretic.  Eyes:     General: No  scleral icterus.       Right eye: No discharge.     Conjunctiva/sclera: Conjunctivae normal.     Pupils: Pupils are equal, round, and reactive to light.  Neck:     Musculoskeletal: Neck supple.     Thyroid: No thyromegaly.  Cardiovascular:     Rate and Rhythm: Normal rate and regular rhythm.     Heart sounds: Normal heart sounds. No murmur.  Pulmonary:     Effort: Pulmonary effort is normal. No respiratory distress.     Breath sounds: Normal breath sounds. No wheezing.  Musculoskeletal: Normal range of motion.        General: No tenderness (No tenderness to palpation on exam or with range of motion, it seems to be doing well now and he says it does do well when he gets loosened up through the day.).  Lymphadenopathy:     Cervical: No cervical adenopathy.  Skin:    General: Skin is warm and dry.     Findings: No rash.  Neurological:     Mental Status: He is alert and oriented to person, place, and time.     Coordination: Coordination normal.  Psychiatric:        Behavior: Behavior normal.       Assessment & Plan:   Problem List Items Addressed This Visit      Other   Testosterone deficiency   Relevant Medications   Testosterone 10 MG/ACT (2%) GEL    Other Visit Diagnoses    Obesity (BMI 30.0-34.9)    -  Primary   Need for immunization against influenza       Relevant Orders   Flu Vaccine QUAD 36+ mos IM (Completed)   Encounter for weight management       Relevant Medications   phentermine 37.5 MG capsule   Strain of lumbar region, initial encounter       Relevant Medications   cyclobenzaprine (FLEXERIL) 10 MG tablet      Commended stretching and muscle relaxer for his lower back if that does not improve it then we can consider prednisone in the future.  Refill on his testosterone and refill on his phentermine, will do 1 more month on his phentermine but if he does not lose weight again then we may have to take a sabbatical from it. Follow up plan: Return in  about 4 weeks (around 07/02/2019), or if symptoms worsen or fail to improve, for Weight recheck.  Counseling provided for all of the vaccine components Orders Placed This Encounter  Procedures  . Flu Vaccine QUAD 36+ mos IM  Caryl Pina, MD [facility] 06/04/2019, 11:36 AM

## 2019-07-21 ENCOUNTER — Other Ambulatory Visit: Payer: Self-pay | Admitting: Family Medicine

## 2019-07-21 DIAGNOSIS — I10 Essential (primary) hypertension: Secondary | ICD-10-CM

## 2019-07-29 ENCOUNTER — Other Ambulatory Visit: Payer: Self-pay | Admitting: Family Medicine

## 2019-07-29 DIAGNOSIS — G4700 Insomnia, unspecified: Secondary | ICD-10-CM

## 2019-07-29 DIAGNOSIS — K219 Gastro-esophageal reflux disease without esophagitis: Secondary | ICD-10-CM

## 2019-07-29 DIAGNOSIS — I10 Essential (primary) hypertension: Secondary | ICD-10-CM

## 2019-07-29 NOTE — Telephone Encounter (Signed)
What is the name of the medication? traZODone (DESYREL) 50 MG tablet lisinopril (PRINIVIL,ZESTRIL) 40 MG tablet  amLODipine (NORVASC) 5 MG tablet omeprazole (PRILOSEC) 40 MG capsule  omeprazole (PRILOSEC) 40 MG capsule   Have you contacted your pharmacy to request a refill? YES  Which pharmacy would you like this sent to?  [facility] had appt with Dr. Keturah Barre on 06/04/2019  Patient notified that their request is being sent to the clinical staff for review and that they should receive a call once it is complete. If they do not receive a call within 24 hours they can check with their pharmacy or our office.

## 2019-07-30 MED ORDER — LISINOPRIL 40 MG PO TABS: 40.0000 mg | ORAL_TABLET | Freq: Every day | ORAL | 1 refills | Status: DC

## 2019-07-30 MED ORDER — OMEPRAZOLE 40 MG PO CPDR: 40.0000 mg | DELAYED_RELEASE_CAPSULE | Freq: Every day | ORAL | 1 refills | Status: DC

## 2019-07-30 MED ORDER — TRAZODONE HCL 50 MG PO TABS: 50.0000 mg | ORAL_TABLET | Freq: Every evening | ORAL | 1 refills | Status: DC | PRN

## 2019-08-25 ENCOUNTER — Encounter: Payer: Self-pay | Admitting: Family Medicine

## 2019-10-16 ENCOUNTER — Other Ambulatory Visit: Payer: Self-pay

## 2019-10-17 ENCOUNTER — Encounter: Payer: Self-pay | Admitting: Family Medicine

## 2019-10-17 ENCOUNTER — Other Ambulatory Visit: Payer: Self-pay

## 2019-10-17 ENCOUNTER — Ambulatory Visit (INDEPENDENT_AMBULATORY_CARE_PROVIDER_SITE_OTHER): Admitting: Family Medicine

## 2019-10-17 VITALS — BP 135/82 | HR 70 | Temp 97.8°F | Ht 66.0 in | Wt 199.0 lb

## 2019-10-17 DIAGNOSIS — N529 Male erectile dysfunction, unspecified: Secondary | ICD-10-CM

## 2019-10-17 DIAGNOSIS — D171 Benign lipomatous neoplasm of skin and subcutaneous tissue of trunk: Secondary | ICD-10-CM

## 2019-10-17 MED ORDER — SILDENAFIL CITRATE 100 MG PO TABS: 50.0000 mg | ORAL_TABLET | Freq: Every day | ORAL | 5 refills | Status: DC | PRN

## 2019-10-17 NOTE — Progress Notes (Signed)
Assessment & Plan:  1. Lipoma of back - Education provided on lipomas. - Ambulatory referral to General Surgery  2. Erectile dysfunction, unspecified erectile dysfunction type - Patient in need of refills.  Advised to use a good Rx discount. - sildenafil (VIAGRA) 100 MG tablet; Take 0.5-1 tablets (50-100 mg total) by mouth daily as needed for erectile dysfunction.  Dispense: 30 tablet; Refill: 5   Follow up plan: Return ASAP with PCP, for for routine follow-up.  [name], MSN, APRN, FNP-C [facility]  Subjective:   Patient ID: Devin Santiago, male    DOB: [birth year], 64 y.o.   MRN: [id number]  HPI: Devin Santiago is a 64 y.o. male presenting on 10/17/2019 for Lipoma (lower mid right )  Patient has a lipoma in the middle of the right side of his back that has been there for years. He reports he had an ultrasound of it completed years ago and was told it was growth "dead tissue".  He reports it is starting to bother him.  He is noticing and feeling it more as he has been exercising more and it is very irritated by the end of the day.   ROS: Negative unless specifically indicated above in HPI.   Relevant past medical history reviewed and updated as indicated.   Allergies and medications reviewed and updated.   Current Outpatient Medications:  .  amLODipine (NORVASC) 5 MG tablet, TAKE 1 TABLET DAILY, Disp: 90 tablet, Rfl: 0 .  lisinopril (ZESTRIL) 40 MG tablet, Take 1 tablet (40 mg total) by mouth daily., Disp: 90 tablet, Rfl: 1 .  omeprazole (PRILOSEC) 40 MG capsule, Take 1 capsule (40 mg total) by mouth daily., Disp: 90 capsule, Rfl: 1 .  sildenafil (VIAGRA) 100 MG tablet, Take 0.5-1 tablets (50-100 mg total) by mouth daily as needed for erectile dysfunction., Disp: 30 tablet, Rfl: 5 .  Testosterone 10 MG/ACT (2%) GEL, Apply 20 mg topically daily., Disp: 180 g, Rfl: 1 .  traZODone (DESYREL) 50 MG tablet, Take 1 tablet (50 mg total) by mouth at bedtime as  needed for sleep., Disp: 90 tablet, Rfl: 1 .  ketoconazole (NIZORAL) 2 % cream, Apply 1 application topically daily. (Patient not taking: Reported on 10/17/2019), Disp: 60 g, Rfl: 2  No Known Allergies  Objective:   BP 135/82   Pulse 70   Temp 97.8 F (36.6 C) (Temporal)   Ht 5\' 6"  (1.676 m)   Wt 199 lb (90.3 kg)   BMI 32.12 kg/m    Physical Exam Vitals reviewed.  Constitutional:      General: He is not in acute distress.    Appearance: Normal appearance. He is not ill-appearing, toxic-appearing or diaphoretic.  HENT:     Head: Normocephalic and atraumatic.  Eyes:     General: No scleral icterus.       Right eye: No discharge.        Left eye: No discharge.     Conjunctiva/sclera: Conjunctivae normal.  Cardiovascular:     Rate and Rhythm: Normal rate.  Pulmonary:     Effort: Pulmonary effort is normal. No respiratory distress.  Musculoskeletal:        General: Normal range of motion.     Cervical back: Normal range of motion.  Skin:    General: Skin is warm and dry.     Comments: Lipoma to the middle right side of patient's back.  Neurological:     Mental Status: He is alert and oriented to  person, place, and time. Mental status is at baseline.  Psychiatric:        Mood and Affect: Mood normal.        Behavior: Behavior normal.        Thought Content: Thought content normal.        Judgment: Judgment normal.

## 2019-10-17 NOTE — Patient Instructions (Addendum)
Use the GoodRx discount for your sildenafil.    Lipoma  A lipoma is a noncancerous (benign) tumor that is made up of fat cells. This is a very common type of soft-tissue growth. Lipomas are usually found under the skin (subcutaneous). They may occur in any tissue of the body that contains fat. Common areas for lipomas to appear include the back, arms, shoulders, buttocks, and thighs. Lipomas grow slowly, and they are usually painless. Most lipomas do not cause problems and do not require treatment. What are the causes? The cause of this condition is not known. What increases the risk? You are more likely to develop this condition if:  You are 83-25 years old.  You have a family history of lipomas. What are the signs or symptoms? A lipoma usually appears as a small, round bump under the skin. In most cases, the lump will:  Feel soft or rubbery.  Not cause pain or other symptoms. However, if a lipoma is located in an area where it pushes on nerves, it can become painful or cause other symptoms. How is this diagnosed? A lipoma can usually be diagnosed with a physical exam. You may also have tests to confirm the diagnosis and to rule out other conditions. Tests may include:  Imaging tests, such as a CT scan or an MRI.  Removal of a tissue sample to be looked at under a microscope (biopsy). How is this treated? Treatment for this condition depends on the size of the lipoma and whether it is causing any symptoms.  For small lipomas that are not causing problems, no treatment is needed.  If a lipoma is bigger or it causes problems, surgery may be done to remove the lipoma. Lipomas can also be removed to improve appearance. Most often, the procedure is done after applying a medicine that numbs the area (local anesthetic).  Liposuction may be done to reduce the size of the lipoma before it is removed through surgery, or it may be done to remove the lipoma. Lipomas are removed with this  method in order to limit incision size and scarring. A liposuction tube is inserted through a small incision into the lipoma, and the contents of the lipoma are removed through the tube with suction. Follow these instructions at home:  Watch your lipoma for any changes.  Keep all follow-up visits as told by your health care provider. This is important. Contact a health care provider if:  Your lipoma becomes larger or hard.  Your lipoma becomes painful, red, or increasingly swollen. These could be signs of infection or a more serious condition. Get help right away if:  You develop tingling or numbness in an area near the lipoma. This could indicate that the lipoma is causing nerve damage. Summary  A lipoma is a noncancerous tumor that is made up of fat cells.  Most lipomas do not cause problems and do not require treatment.  If a lipoma is bigger or it causes problems, surgery may be done to remove the lipoma.  Contact a health care provider if your lipoma becomes larger or hard, or if it becomes painful, red, or increasingly swollen. Pain, redness, and swelling could be signs of infection or a more serious condition. This information is not intended to replace advice given to you by your health care provider. Make sure you discuss any questions you have with your health care provider. Document Revised: 03/17/2019 Document Reviewed: 03/17/2019 Elsevier Patient Education  [place].

## 2019-10-19 ENCOUNTER — Other Ambulatory Visit: Payer: Self-pay | Admitting: Family Medicine

## 2019-10-19 DIAGNOSIS — I10 Essential (primary) hypertension: Secondary | ICD-10-CM

## 2019-10-28 ENCOUNTER — Telehealth: Payer: Self-pay

## 2019-10-28 ENCOUNTER — Ambulatory Visit (INDEPENDENT_AMBULATORY_CARE_PROVIDER_SITE_OTHER): Admitting: General Surgery

## 2019-10-28 ENCOUNTER — Encounter: Payer: Self-pay | Admitting: General Surgery

## 2019-10-28 ENCOUNTER — Other Ambulatory Visit: Payer: Self-pay

## 2019-10-28 VITALS — BP 147/84 | HR 74 | Temp 98.3°F | Resp 12 | Ht 66.0 in | Wt 198.0 lb

## 2019-10-28 DIAGNOSIS — D171 Benign lipomatous neoplasm of skin and subcutaneous tissue of trunk: Secondary | ICD-10-CM | POA: Diagnosis not present

## 2019-10-28 NOTE — Patient Instructions (Signed)
Lipoma  A lipoma is a noncancerous (benign) tumor that is made up of fat cells. This is a very common type of soft-tissue growth. Lipomas are usually found under the skin (subcutaneous). They may occur in any tissue of the body that contains fat. Common areas for lipomas to appear include the back, arms, shoulders, buttocks, and thighs. Lipomas grow slowly, and they are usually painless. Most lipomas do not cause problems and do not require treatment. What are the causes? The cause of this condition is not known. What increases the risk? You are more likely to develop this condition if:  You are 23-50 years old.  You have a family history of lipomas. What are the signs or symptoms? A lipoma usually appears as a small, round bump under the skin. In most cases, the lump will:  Feel soft or rubbery.  Not cause pain or other symptoms. However, if a lipoma is located in an area where it pushes on nerves, it can become painful or cause other symptoms. How is this diagnosed? A lipoma can usually be diagnosed with a physical exam. You may also have tests to confirm the diagnosis and to rule out other conditions. Tests may include:  Imaging tests, such as a CT scan or an MRI.  Removal of a tissue sample to be looked at under a microscope (biopsy). How is this treated? Treatment for this condition depends on the size of the lipoma and whether it is causing any symptoms.  For small lipomas that are not causing problems, no treatment is needed.  If a lipoma is bigger or it causes problems, surgery may be done to remove the lipoma. Lipomas can also be removed to improve appearance. Most often, the procedure is done after applying a medicine that numbs the area (local anesthetic).  Liposuction may be done to reduce the size of the lipoma before it is removed through surgery, or it may be done to remove the lipoma. Lipomas are removed with this method in order to limit incision size and scarring. A  liposuction tube is inserted through a small incision into the lipoma, and the contents of the lipoma are removed through the tube with suction. Follow these instructions at home:  Watch your lipoma for any changes.  Keep all follow-up visits as told by your health care provider. This is important. Contact a health care provider if:  Your lipoma becomes larger or hard.  Your lipoma becomes painful, red, or increasingly swollen. These could be signs of infection or a more serious condition. Get help right away if:  You develop tingling or numbness in an area near the lipoma. This could indicate that the lipoma is causing nerve damage. Summary  A lipoma is a noncancerous tumor that is made up of fat cells.  Most lipomas do not cause problems and do not require treatment.  If a lipoma is bigger or it causes problems, surgery may be done to remove the lipoma.  Contact a health care provider if your lipoma becomes larger or hard, or if it becomes painful, red, or increasingly swollen. Pain, redness, and swelling could be signs of infection or a more serious condition. This information is not intended to replace advice given to you by your health care provider. Make sure you discuss any questions you have with your health care provider. Document Revised: 03/17/2019 Document Reviewed: 03/17/2019 Elsevier Patient Education  [place].   Lipoma Removal  Lipoma removal is a surgical procedure to remove a  lipoma, which is a noncancerous (benign) tumor that is made up of fat cells. Most lipomas are small and painless and do not require treatment. They can form in many areas of the body but are most common under the skin of the back, arms, shoulders, buttocks, and thighs. You may need lipoma removal if you have a lipoma that is large, growing, or causing discomfort. Lipoma removal may also be done for cosmetic reasons. Tell a health care provider about:  Any allergies you have.  All  medicines you are taking, including vitamins, herbs, eye drops, creams, and over-the-counter medicines.  Any problems you or family members have had with anesthetic medicines.  Any blood disorders you have.  Any surgeries you have had.  Any medical conditions you have.  Whether you are pregnant or may be pregnant. What are the risks? Generally, this is a safe procedure. However, problems may occur, including:  Infection.  Bleeding.  Scarring.  Allergic reactions to medicines.  Damage to nearby structures or organs, such as damage to nerves or blood vessels near the lipoma. Medicines Ask your health care provider about:  Changing or stopping your regular medicines. This is especially important if you are taking diabetes medicines or blood thinners.  Taking medicines such as aspirin and ibuprofen. These medicines can thin your blood. Do not take these medicines unless your health care provider tells you to take them.  Taking over-the-counter medicines, vitamins, herbs, and supplements. General instructions  You will have a physical exam. Your health care provider will check the size of the lipoma and whether it can be moved easily.  You may have a biopsy and imaging tests, such as X-rays, a CT scan, and an MRI.  Do not use any products that contain nicotine or tobacco for at least 4 weeks before the procedure. These products include cigarettes, e-cigarettes, and chewing tobacco. If you need help quitting, ask your health care provider.  Ask your health care provider: ? How your surgery site will be marked. ? What steps will be taken to help prevent infection. These may include:  Washing skin with a germ-killing soap.  Taking antibiotic medicine.  Plan to have someone take you home from the hospital or clinic.  If you will be going home right after the procedure, plan to have someone with you for 24 hours. What happens during the procedure?   An IV will be inserted  into one of your veins.  You will be given one or more of the following: ? A medicine to help you relax (sedative). ? A medicine to numb the area (local anesthetic). ? A medicine to make you fall asleep (general anesthetic). ? A medicine that is injected into an area of your body to numb everything below the injection site (regional anesthetic).  An incision will be made over the lipoma or very near the lipoma. The incision may be made in a natural skin line or crease.  Tissues, nerves, and blood vessels near the lipoma will be moved out of the way.  The lipoma and the capsule that surrounds it will be separated from the surrounding tissues.  The lipoma will be removed.  The incision may be closed with stitches (sutures).  A bandage (dressing) will be placed over the incision. The procedure may vary among health care providers and hospitals. What happens after the procedure?  Your blood pressure, heart rate, breathing rate, and blood oxygen level will be monitored until you leave the hospital or clinic.  If you were prescribed an antibiotic medicine, use it as told by your health care provider. Do not stop using the antibiotic even if you start to feel better.  If you were given a sedative during the procedure, it can affect you for several hours. Do not drive or operate machinery until your health care provider says that it is safe. Summary  Before the procedure, follow instructions from your health care provider about eating and drinking, and changing or stopping your regular medicines. This is especially important if you are taking diabetes medicines or blood thinners.  After the lipoma is removed, the incision may be closed with stitches (sutures) and covered with a bandage (dressing).  If you were given a sedative during the procedure, it can affect you for several hours. Do not drive or operate machinery until your health care provider says that it is safe. This information is  not intended to replace advice given to you by your health care provider. Make sure you discuss any questions you have with your health care provider. Document Revised: 03/17/2019 Document Reviewed: 03/17/2019 Elsevier Patient Education  [place].

## 2019-10-28 NOTE — Telephone Encounter (Signed)
Surgery date 11/19/19 @ 7:30 am Pre-op- 11/17/19 @ 12:45 pm Covid- 11/17/19 @ 2pm  Patient verbalized understanding.

## 2019-10-28 NOTE — Progress Notes (Signed)
[facility] History and Physical  Reason for Referral: Right back lipoma  Referring Physician:  Dettinger, Fransisca Kaufmann, MD  Chief Complaint    New Patient (Initial Visit)      Devin Santiago is a 64 y.o. male.  HPI: Devin Santiago is a very pleasant 64 yo who reports he has been trying to lose weight and get fit over the past several years. He has noticed a bulge/ mass on the right back for at least 5 years and says it may have grown slightly versus becoming more obvious with his weight loss. He says that it can be sore at times with movement and certain exercises like crunches and planks. This area has never been infected or swollen.   He otherwise is pretty healthy and takes minimal medications. He has not had surgeries.  Past Medical History:  Diagnosis Date  . GERD (gastroesophageal reflux disease)   . Hypertension   . Insomnia     History reviewed. No pertinent surgical history.  Family History  Problem Relation Age of Onset  . Diabetes Mother   . Heart disease Mother     Social History   Tobacco Use  . Smoking status: Former Smoker    Types: Cigarettes    Quit date: 07/17/1998    Years since quitting: 21.2  . Smokeless tobacco: Never Used  Substance Use Topics  . Alcohol use: Yes    Comment: occ  . Drug use: No    Medications: I have reviewed the patient's current medications. Allergies as of 10/28/2019   No Known Allergies     Medication List       Accurate as of October 28, 2019  9:33 AM. If you have any questions, ask your nurse or doctor.        amLODipine 5 MG tablet Commonly known as: NORVASC TAKE 1 TABLET DAILY   ketoconazole 2 % cream Commonly known as: NIZORAL Apply 1 application topically daily.   lisinopril 40 MG tablet Commonly known as: ZESTRIL Take 1 tablet (40 mg total) by mouth daily.   omeprazole 40 MG capsule Commonly known as: PRILOSEC Take 1 capsule (40 mg total) by mouth daily.   sildenafil 100 MG tablet Commonly  known as: Viagra Take 0.5-1 tablets (50-100 mg total) by mouth daily as needed for erectile dysfunction.   Testosterone 10 MG/ACT (2%) Gel Apply 20 mg topically daily.   traZODone 50 MG tablet Commonly known as: DESYREL Take 1 tablet (50 mg total) by mouth at bedtime as needed for sleep.        ROS:  A comprehensive review of systems was negative except for: Gastrointestinal: positive for reflux symptoms Musculoskeletal: positive for back pain and at sight of mass/ with activity  Blood pressure (!) 147/84, pulse 74, temperature 98.3 F (36.8 C), temperature source Oral, resp. rate 12, height 5\' 6"  (1.676 m), weight 198 lb (89.8 kg), SpO2 97 %. Physical Exam Vitals reviewed.  Constitutional:      Appearance: He is normal weight.  HENT:     Head: Normocephalic and atraumatic.     Nose: Nose normal.     Mouth/Throat:     Mouth: Mucous membranes are moist.  Eyes:     Extraocular Movements: Extraocular movements intact.     Pupils: Pupils are equal, round, and reactive to light.  Cardiovascular:     Rate and Rhythm: Normal rate and regular rhythm.  Pulmonary:     Effort: Pulmonary effort is normal.  Breath sounds: Normal breath sounds.  Abdominal:     General: There is no distension.     Palpations: Abdomen is soft.     Tenderness: There is no abdominal tenderness.  Musculoskeletal:        General: Normal range of motion.     Cervical back: Normal range of motion. No rigidity.     Comments: Right back with 5cm mass in the lower back, soft, superficial, more obvious with stretching back   Skin:    General: Skin is warm and dry.  Neurological:     General: No focal deficit present.     Mental Status: He is alert and oriented to person, place, and time.  Psychiatric:        Mood and Affect: Mood normal.        Behavior: Behavior normal.        Thought Content: Thought content normal.        Judgment: Judgment normal.     Results: personally reviewed- lipoma like  lesion in the right flank  US Abdomen- Right flank  CLINICAL DATA:  Abdominal soft tissue mass along the right posterior flank  EXAM: LIMITED ABDOMINAL ULTRASOUND  COMPARISON:  None.  FINDINGS: High-frequency linear transducer is utilized for evaluation of a palpable abnormality in the right posterior flank area.  4.6 x 1.5 x 4 cm well-defined hypoechoic mass is identified in the right flank without internal Doppler flow. The mass is isoechoic to the adjacent subcutaneous fat.  There is no other solid or cystic mass.  IMPRESSION: 4.6 x 1.5 x 4 cm well-defined mass in the right posterior flank at the site of clinical palpable abnormality. Mass is isoechoic to the adjacent subcutaneous fat suggesting a lipoma. If there is further characterization desired, evaluation with MRI is recommended.   Electronically Signed   By: Kathreen Devoid   On: 05/19/2016 09:23   Assessment & Plan:  Devin Santiago is a 64 y.o. male with right back lipoma that causes the patient some pain and discomfort.  He wants to get the lipoma removed due to his discomfort.   -Excision of lipoma from back with some sedation -Discussed risk of bleeding, infection, recurrence, need for sutures in place that will be removed in 2 weeks  -He wants to plan for 4/7 given a vacation he has planned.   All questions were answered to the satisfaction of the patient.  Virl Cagey 10/28/2019, 9:33 AM

## 2019-10-29 NOTE — H&P (Signed)
[facility] History and Physical  Reason for Referral: Right back lipoma  Referring Physician: Dettinger, Fransisca Kaufmann, MD     Chief Complaint     New Patient (Initial Visit)      Devin Santiago is a 64 y.o. male.  HPI: Devin Santiago is a very pleasant 64 yo who reports Devin Santiago has been trying to lose weight and get fit over the past several years. Devin Santiago has noticed a bulge/ mass on the right back for at least 5 years and says it may have grown slightly versus becoming more obvious with his weight loss. Devin Santiago says that it can be sore at times with movement and certain exercises like crunches and planks. This area has never been infected or swollen.  Devin Santiago otherwise is pretty healthy and takes minimal medications. Devin Santiago has not had surgeries.      Past Medical History:  Diagnosis Date  . GERD (gastroesophageal reflux disease)   . Hypertension   . Insomnia    History reviewed. No pertinent surgical history.       Family History  Problem Relation Age of Onset  . Diabetes Mother   . Heart disease Mother    Social History        Tobacco Use  . Smoking status: Former Smoker    Types: Cigarettes    Quit date: 07/17/1998    Years since quitting: 21.2  . Smokeless tobacco: Never Used  Substance Use Topics  . Alcohol use: Yes    Comment: occ  . Drug use: No   Medications: I have reviewed the patient's current medications.  Allergies as of 10/28/2019   No Known Allergies          Medication List       Accurate as of October 28, 2019 9:33 AM. If you have any questions, ask your nurse or doctor.        amLODipine 5 MG tablet  Commonly known as: NORVASC  TAKE 1 TABLET DAILY   ketoconazole 2 % cream  Commonly known as: NIZORAL  Apply 1 application topically daily.   lisinopril 40 MG tablet  Commonly known as: ZESTRIL  Take 1 tablet (40 mg total) by mouth daily.   omeprazole 40 MG capsule  Commonly known as: PRILOSEC  Take 1 capsule (40 mg total) by mouth daily.   sildenafil  100 MG tablet  Commonly known as: Viagra  Take 0.5-1 tablets (50-100 mg total) by mouth daily as needed for erectile dysfunction.   Testosterone 10 MG/ACT (2%) Gel  Apply 20 mg topically daily.   traZODone 50 MG tablet  Commonly known as: DESYREL  Take 1 tablet (50 mg total) by mouth at bedtime as needed for sleep.       ROS:  A comprehensive review of systems was negative except for: Gastrointestinal: positive for reflux symptoms  Musculoskeletal: positive for back pain and at sight of mass/ with activity  Blood pressure (!) 147/84, pulse 74, temperature 98.3 F (36.8 C), temperature source Oral, resp. rate 12, height 5\' 6"  (1.676 m), weight 198 lb (89.8 kg), SpO2 97 %.  Physical Exam  Vitals reviewed.  Constitutional:  Appearance: Devin Santiago is normal weight.  HENT:  Head: Normocephalic and atraumatic.  Nose: Nose normal.  Mouth/Throat:  Mouth: Mucous membranes are moist.  Eyes:  Extraocular Movements: Extraocular movements intact.  Pupils: Pupils are equal, round, and reactive to light.  Cardiovascular:  Rate and Rhythm: Normal rate and regular rhythm.  Pulmonary:  Effort: Pulmonary effort  is normal.  Breath sounds: Normal breath sounds.  Abdominal:  General: There is no distension.  Palpations: Abdomen is soft.  Tenderness: There is no abdominal tenderness.  Musculoskeletal:  General: Normal range of motion.  Cervical back: Normal range of motion. No rigidity.  Comments: Right back with 5cm mass in the lower back, soft, superficial, more obvious with stretching back  Skin:  General: Skin is warm and dry.  Neurological:  General: No focal deficit present.  Mental Status: Devin Santiago is alert and oriented to person, place, and time.  Psychiatric:  Mood and Affect: Mood normal.  Behavior: Behavior normal.  Thought Content: Thought content normal.  Judgment: Judgment normal.   Results: personally reviewed- lipoma like lesion in the right flank  US Abdomen- Right flank  CLINICAL  DATA: Abdominal soft tissue mass along the right posterior  flank  EXAM:  LIMITED ABDOMINAL ULTRASOUND  COMPARISON: None.  FINDINGS:  High-frequency linear transducer is utilized for evaluation of a  palpable abnormality in the right posterior flank area.  4.6 x 1.5 x 4 cm well-defined hypoechoic mass is identified in the  right flank without internal Doppler flow. The mass is isoechoic to  the adjacent subcutaneous fat.  There is no other solid or cystic mass.  IMPRESSION:  4.6 x 1.5 x 4 cm well-defined mass in the right posterior flank at  the site of clinical palpable abnormality. Mass is isoechoic to the  adjacent subcutaneous fat suggesting a lipoma. If there is further  characterization desired, evaluation with MRI is recommended.  Electronically Signed  By: Kathreen Devoid  On: 05/19/2016 09:23   Assessment & Plan:  Devin Santiago is a 64 y.o. male with right back lipoma that causes the patient some pain and discomfort. Devin Santiago wants to get the lipoma removed due to his discomfort.  -Excision of lipoma from back with some sedation  -Discussed risk of bleeding, infection, recurrence, need for sutures in place that will be removed in 2 weeks  -Devin Santiago wants to plan for 4/7 given a vacation Devin Santiago has planned.  All questions were answered to the satisfaction of the patient.  Virl Cagey  10/28/2019, 9:33 AM

## 2019-11-17 ENCOUNTER — Other Ambulatory Visit: Payer: Self-pay

## 2019-11-17 ENCOUNTER — Other Ambulatory Visit (HOSPITAL_COMMUNITY)
Admission: RE | Admit: 2019-11-17 | Discharge: 2019-11-17 | Disposition: A | Discharge status: Home / Self Care | Source: Ambulatory Visit | Attending: General Surgery | Admitting: General Surgery

## 2019-11-17 ENCOUNTER — Encounter (HOSPITAL_COMMUNITY): Payer: Self-pay

## 2019-11-17 ENCOUNTER — Encounter (HOSPITAL_COMMUNITY)
Admission: RE | Admit: 2019-11-17 | Discharge: 2019-11-17 | Disposition: A | Discharge status: Home / Self Care | Source: Ambulatory Visit | Attending: General Surgery | Admitting: General Surgery

## 2019-11-17 DIAGNOSIS — Z01810 Encounter for preprocedural cardiovascular examination: Secondary | ICD-10-CM | POA: Insufficient documentation

## 2019-11-17 DIAGNOSIS — Z01812 Encounter for preprocedural laboratory examination: Secondary | ICD-10-CM | POA: Insufficient documentation

## 2019-11-17 DIAGNOSIS — Z20822 Contact with and (suspected) exposure to covid-19: Secondary | ICD-10-CM | POA: Diagnosis not present

## 2019-11-17 LAB — BASIC METABOLIC PANEL
Anion gap: 8 (ref 5–15)
BUN: 22 mg/dL (ref 8–23)
CO2: 22 mmol/L (ref 22–32)
Calcium: 9 mg/dL (ref 8.9–10.3)
Chloride: 109 mmol/L (ref 98–111)
Creatinine, Ser: 1.34 mg/dL — ABNORMAL HIGH (ref 0.61–1.24)
GFR calc Af Amer: >60 mL/min (ref >60)
GFR calc non Af Amer: 56 mL/min — ABNORMAL LOW (ref >60)
Glucose, Bld: 137 mg/dL — ABNORMAL HIGH (ref 70–99)
Potassium: 3.7 mmol/L (ref 3.5–5.1)
Sodium: 139 mmol/L (ref 135–145)

## 2019-11-17 LAB — CBC
HCT: 52.2 % — ABNORMAL HIGH (ref 39.0–52.0)
Hemoglobin: 16.6 g/dL (ref 13.0–17.0)
MCH: 28.9 pg (ref 26.0–34.0)
MCHC: 31.8 g/dL (ref 30.0–36.0)
MCV: 90.9 fL (ref 80.0–100.0)
Platelets: 210 10*3/uL (ref 150–400)
RBC: 5.74 MIL/uL (ref 4.22–5.81)
RDW: 12.4 % (ref 11.5–15.5)
WBC: 8.5 10*3/uL (ref 4.0–10.5)
nRBC: 0 % (ref 0.0–0.2)

## 2019-11-17 LAB — SARS CORONAVIRUS 2 (TAT 6-24 HRS): SARS Coronavirus 2: NEGATIVE

## 2019-11-17 NOTE — Patient Instructions (Addendum)
Devin Santiago  11/17/2019     @PREFPERIOPPHARMACY @   Your procedure is scheduled on 11/19/2019.  Report to [facility] at 6:15 A.M.  Call this number if you have problems the morning of surgery:  [phone]   Remember:  Do not eat or drink after midnight.   Take these medicines the morning of surgery with A SIP OF WATER Lisinopril Omeprazole Amlodipine    Do not wear jewelry, make-up or nail polish.  Do not wear lotions, powders, or perfumes, or deodorant.  Do not shave 48 hours prior to surgery.  Men may shave face and neck.  Do not bring valuables to the hospital.  [facility] is not responsible for any belongings or valuables.  Contacts, dentures or bridgework may not be worn into surgery.  Leave your suitcase in the car.  After surgery it may be brought to your room.  For patients admitted to the hospital, discharge time will be determined by your treatment team.  Patients discharged the day of surgery will not be allowed to drive home.   Name and phone number of your driver:   family Special instructions:  n/a  Please read over the following fact sheets that you were given. Care and Recovery After Surgery  Lipoma  A lipoma is a noncancerous (benign) tumor that is made up of fat cells. This is a very common type of soft-tissue growth. Lipomas are usually found under the skin (subcutaneous). They may occur in any tissue of the body that contains fat. Common areas for lipomas to appear include the back, arms, shoulders, buttocks, and thighs. Lipomas grow slowly, and they are usually painless. Most lipomas do not cause problems and do not require treatment. What are the causes? The cause of this condition is not known. What increases the risk? You are more likely to develop this condition if:  You are 11-24 years old.  You have a family history of lipomas. What are the signs or symptoms? A lipoma usually appears as a small, round bump under the skin. In most cases, the  lump will:  Feel soft or rubbery.  Not cause pain or other symptoms. However, if a lipoma is located in an area where it pushes on nerves, it can become painful or cause other symptoms. How is this diagnosed? A lipoma can usually be diagnosed with a physical exam. You may also have tests to confirm the diagnosis and to rule out other conditions. Tests may include:  Imaging tests, such as a CT scan or an MRI.  Removal of a tissue sample to be looked at under a microscope (biopsy). How is this treated? Treatment for this condition depends on the size of the lipoma and whether it is causing any symptoms.  For small lipomas that are not causing problems, no treatment is needed.  If a lipoma is bigger or it causes problems, surgery may be done to remove the lipoma. Lipomas can also be removed to improve appearance. Most often, the procedure is done after applying a medicine that numbs the area (local anesthetic).  Liposuction may be done to reduce the size of the lipoma before it is removed through surgery, or it may be done to remove the lipoma. Lipomas are removed with this method in order to limit incision size and scarring. A liposuction tube is inserted through a small incision into the lipoma, and the contents of the lipoma are removed through the tube with suction. Follow these instructions at home:  Watch  your lipoma for any changes.  Keep all follow-up visits as told by your health care provider. This is important. Contact a health care provider if:  Your lipoma becomes larger or hard.  Your lipoma becomes painful, red, or increasingly swollen. These could be signs of infection or a more serious condition. Get help right away if:  You develop tingling or numbness in an area near the lipoma. This could indicate that the lipoma is causing nerve damage. Summary  A lipoma is a noncancerous tumor that is made up of fat cells.  Most lipomas do not cause problems and do not require  treatment.  If a lipoma is bigger or it causes problems, surgery may be done to remove the lipoma.  Contact a health care provider if your lipoma becomes larger or hard, or if it becomes painful, red, or increasingly swollen. Pain, redness, and swelling could be signs of infection or a more serious condition. This information is not intended to replace advice given to you by your health care provider. Make sure you discuss any questions you have with your health care provider. Document Revised: 03/17/2019 Document Reviewed: 03/17/2019 Elsevier Patient Education  [place].

## 2019-11-19 ENCOUNTER — Encounter (HOSPITAL_COMMUNITY)
Admission: RE | Disposition: A | Discharge status: Home / Self Care | Payer: Self-pay | Source: Home / Self Care | Attending: General Surgery

## 2019-11-19 ENCOUNTER — Ambulatory Visit (HOSPITAL_COMMUNITY): Admitting: Anesthesiology

## 2019-11-19 ENCOUNTER — Encounter (HOSPITAL_COMMUNITY): Payer: Self-pay | Admitting: General Surgery

## 2019-11-19 ENCOUNTER — Ambulatory Visit (HOSPITAL_COMMUNITY)
Admission: RE | Admit: 2019-11-19 | Discharge: 2019-11-19 | Disposition: A | Discharge status: Home / Self Care | Attending: General Surgery | Admitting: General Surgery

## 2019-11-19 DIAGNOSIS — Z87891 Personal history of nicotine dependence: Secondary | ICD-10-CM | POA: Insufficient documentation

## 2019-11-19 DIAGNOSIS — D171 Benign lipomatous neoplasm of skin and subcutaneous tissue of trunk: Secondary | ICD-10-CM | POA: Diagnosis not present

## 2019-11-19 DIAGNOSIS — Z79899 Other long term (current) drug therapy: Secondary | ICD-10-CM | POA: Diagnosis not present

## 2019-11-19 DIAGNOSIS — K219 Gastro-esophageal reflux disease without esophagitis: Secondary | ICD-10-CM | POA: Diagnosis not present

## 2019-11-19 DIAGNOSIS — I10 Essential (primary) hypertension: Secondary | ICD-10-CM | POA: Diagnosis not present

## 2019-11-19 HISTORY — PX: LIPOMA EXCISION: SHX5283

## 2019-11-19 SURGERY — EXCISION LIPOMA
Anesthesia: Monitor Anesthesia Care | Site: Back | Laterality: Right

## 2019-11-19 MED ORDER — PROPOFOL 10 MG/ML IV BOLUS
INTRAVENOUS | Status: DC | PRN
  Administered 2019-11-19 (×3): 20 mg via INTRAVENOUS

## 2019-11-19 MED ORDER — SODIUM CHLORIDE 0.9 % IR SOLN
Status: DC | PRN
  Administered 2019-11-19: 1000 mL

## 2019-11-19 MED ORDER — KETAMINE HCL 10 MG/ML IJ SOLN
INTRAMUSCULAR | Status: DC | PRN
  Administered 2019-11-19 (×2): 10 mg via INTRAVENOUS

## 2019-11-19 MED ORDER — PROPOFOL 500 MG/50ML IV EMUL
INTRAVENOUS | Status: DC | PRN
  Administered 2019-11-19: 150 ug/kg/min via INTRAVENOUS

## 2019-11-19 MED ORDER — ONDANSETRON HCL 4 MG PO TABS: 4.0000 mg | ORAL_TABLET | Freq: Every day | ORAL | 0 refills | Status: DC | PRN

## 2019-11-19 MED ORDER — BACITRACIN ZINC 500 UNIT/GM EX OINT
TOPICAL_OINTMENT | CUTANEOUS | Status: AC
  Filled 2019-11-19: qty 0.9

## 2019-11-19 MED ORDER — LACTATED RINGERS IV SOLN: Freq: Once | INTRAVENOUS | Status: AC

## 2019-11-19 MED ORDER — MIDAZOLAM HCL 5 MG/5ML IJ SOLN
INTRAMUSCULAR | Status: DC | PRN
  Administered 2019-11-19: 2 mg via INTRAVENOUS

## 2019-11-19 MED ORDER — MIDAZOLAM HCL 2 MG/2ML IJ SOLN
INTRAMUSCULAR | Status: AC
  Filled 2019-11-19: qty 2

## 2019-11-19 MED ORDER — BUPIVACAINE HCL (PF) 0.5 % IJ SOLN
INTRAMUSCULAR | Status: AC
  Filled 2019-11-19: qty 30

## 2019-11-19 MED ORDER — CEFAZOLIN SODIUM-DEXTROSE 2-4 GM/100ML-% IV SOLN
2.0000 g | INTRAVENOUS | Status: AC
  Administered 2019-11-19: 2 g via INTRAVENOUS
  Filled 2019-11-19: qty 100

## 2019-11-19 MED ORDER — LIDOCAINE HCL (PF) 1 % IJ SOLN
INTRAMUSCULAR | Status: AC
  Filled 2019-11-19: qty 30

## 2019-11-19 MED ORDER — LACTATED RINGERS IV SOLN: INTRAVENOUS | Status: DC | PRN

## 2019-11-19 MED ORDER — GLYCOPYRROLATE 0.2 MG/ML IJ SOLN
INTRAMUSCULAR | Status: DC | PRN
  Administered 2019-11-19: .1 mg via INTRAVENOUS

## 2019-11-19 MED ORDER — KETAMINE HCL 50 MG/5ML IJ SOSY
PREFILLED_SYRINGE | INTRAMUSCULAR | Status: AC
  Filled 2019-11-19: qty 5

## 2019-11-19 MED ORDER — CHLORHEXIDINE GLUCONATE CLOTH 2 % EX PADS: 6.0000 | MEDICATED_PAD | Freq: Once | CUTANEOUS | Status: DC

## 2019-11-19 MED ORDER — PROPOFOL 10 MG/ML IV BOLUS
INTRAVENOUS | Status: AC
  Filled 2019-11-19: qty 40

## 2019-11-19 MED ORDER — OXYCODONE HCL 5 MG PO TABS: 5.0000 mg | ORAL_TABLET | ORAL | 0 refills | Status: DC | PRN

## 2019-11-19 MED ORDER — DOCUSATE SODIUM 100 MG PO CAPS: 100.0000 mg | ORAL_CAPSULE | Freq: Two times a day (BID) | ORAL | 2 refills | Status: DC

## 2019-11-19 MED ORDER — LIDOCAINE HCL (PF) 1 % IJ SOLN
INTRAMUSCULAR | Status: DC | PRN
  Administered 2019-11-19: 7 mL
  Administered 2019-11-19: 8 mL

## 2019-11-19 MED ORDER — MEPERIDINE HCL 50 MG/ML IJ SOLN: 6.2500 mg | INTRAMUSCULAR | Status: DC | PRN

## 2019-11-19 MED ORDER — ONDANSETRON HCL 4 MG/2ML IJ SOLN: 4.0000 mg | Freq: Once | INTRAMUSCULAR | Status: DC | PRN

## 2019-11-19 MED ORDER — HYDROMORPHONE HCL 1 MG/ML IJ SOLN: 0.2500 mg | INTRAMUSCULAR | Status: DC | PRN

## 2019-11-19 SURGICAL SUPPLY — 32 items
APPLICATOR CHLORAPREP 10.5 ORG (MISCELLANEOUS) ×1 IMPLANT
BLADE 15 SAFETY STRL DISP (BLADE) ×1 IMPLANT
CLOTH BEACON ORANGE TIMEOUT ST (SAFETY) ×2 IMPLANT
COVER LIGHT HANDLE STERIS (MISCELLANEOUS) ×4 IMPLANT
COVER WAND RF STERILE (DRAPES) ×2 IMPLANT
DECANTER SPIKE VIAL GLASS SM (MISCELLANEOUS) ×1 IMPLANT
DRSG TEGADERM 4X10 (GAUZE/BANDAGES/DRESSINGS) ×1 IMPLANT
ELECT REM PT RETURN 9FT ADLT (ELECTROSURGICAL) ×2
ELECTRODE REM PT RTRN 9FT ADLT (ELECTROSURGICAL) ×1 IMPLANT
GAUZE SPONGE 2X2 8PLY STRL LF (GAUZE/BANDAGES/DRESSINGS) ×2 IMPLANT
GLOVE BIO SURGEON STRL SZ 6.5 (GLOVE) ×2 IMPLANT
GLOVE BIO SURGEON STRL SZ7 (GLOVE) ×1 IMPLANT
GLOVE BIOGEL PI IND STRL 6.5 (GLOVE) ×1 IMPLANT
GLOVE BIOGEL PI IND STRL 7.0 (GLOVE) ×1 IMPLANT
GLOVE BIOGEL PI INDICATOR 6.5 (GLOVE) ×1
GLOVE BIOGEL PI INDICATOR 7.0 (GLOVE) ×2
GOWN STRL REUS W/TWL LRG LVL3 (GOWN DISPOSABLE) ×4 IMPLANT
KIT TURNOVER KIT A (KITS) ×2 IMPLANT
MANIFOLD NEPTUNE II (INSTRUMENTS) ×2 IMPLANT
NDL HYPO 25X1 1.5 SAFETY (NEEDLE) ×1 IMPLANT
NEEDLE HYPO 25X1 1.5 SAFETY (NEEDLE) ×2 IMPLANT
NS IRRIG 1000ML POUR BTL (IV SOLUTION) ×2 IMPLANT
PACK MINOR (CUSTOM PROCEDURE TRAY) ×2 IMPLANT
PAD ARMBOARD 7.5X6 YLW CONV (MISCELLANEOUS) ×2 IMPLANT
SET BASIN LINEN APH (SET/KITS/TRAYS/PACK) ×2 IMPLANT
SPONGE GAUZE 2X2 8PLY STRL LF (GAUZE/BANDAGES/DRESSINGS) ×2 IMPLANT
SPONGE GAUZE 2X2 STER 10/PKG (GAUZE/BANDAGES/DRESSINGS) ×2
SUT ETHILON 3 0 FSL (SUTURE) ×1 IMPLANT
SUT VIC AB 3-0 SH 27 (SUTURE) ×1
SUT VIC AB 3-0 SH 27X BRD (SUTURE) ×1 IMPLANT
SYR BULB IRRIGATION 50ML (SYRINGE) ×2 IMPLANT
SYR CONTROL 10ML LL (SYRINGE) ×2 IMPLANT

## 2019-11-19 NOTE — Transfer of Care (Signed)
Immediate Anesthesia Transfer of Care Note  Patient: Devin Santiago  Procedure(s) Performed: EXCISION LIPOMA, 5cm, back, right (Right Back)  Patient Location: PACU  Anesthesia Type:MAC  Level of Consciousness: awake  Airway & Oxygen Therapy: Patient Spontanous Breathing  Post-op Assessment: Report given to RN and Post -op Vital signs reviewed and stable  Post vital signs: Reviewed and stable  Last Vitals:  Vitals Value Taken Time  BP 105/77 11/19/19 0819  Temp    Pulse 79 11/19/19 0820  Resp 16 11/19/19 0820  SpO2 94 % 11/19/19 0820  Vitals shown include unvalidated device data.  Last Pain:  Vitals:   11/19/19 0717  TempSrc: Oral  PainSc: 0-No pain      Patients Stated Pain Goal: 7 (00/93/81 [id number])  Complications: No apparent anesthesia complications

## 2019-11-19 NOTE — Progress Notes (Signed)
[facility]  Sloan and notified surgery completed. Rx sent to [facility]. Sutures to remain in place for about 2 weeks.   Curlene Labrum, MD [facility] [address] [place], [place] [address] [phone] (office)

## 2019-11-19 NOTE — Discharge Instructions (Signed)
Discharge Instructions:  Common Complaints: Pain and bruising at the site. Drainage on the dressing or drainage from the incision after the surgery.  Diet/ Activity: Diet as tolerated.  Remove dressing in 2 days. It is ok to replace it if it gets saturated with some gauze and tape.  Shower per your regular routine. No baths or submerging.  Do not lift or do exercises that pull on your stitches.  Do not pick at the stitches.  Do not place lotions or balms on your incision unless instructed to specifically by Dr. Constance Haw.   Pain Expectations and Narcotics: -After surgery you will have pain associated with your incisions and this is normal. The pain is muscular and nerve pain, and will get better with time. -You are encouraged and expected to take non narcotic medications like tylenol and ibuprofen (when able) to treat pain as multiple modalities can aid with pain treatment. -Narcotics are only used when pain is severe or there is breakthrough pain. -You are not expected to have a pain score of 0 after surgery, as we cannot prevent pain. A pain score of 3-4 that allows you to be functional, move, walk, and tolerate some activity is the goal. The pain will continue to improve over the days after surgery and is dependent on your surgery. -Due to [place] law, we are only able to give a certain amount of pain medication to treat post operative pain, and we only give additional narcotics on a patient by patient basis.  -For small procedures most people need less than 10 pills.   -Having appropriate expectations of pain and knowledge of pain management with non narcotics is important as we do not want anyone to become addicted to narcotic pain medication.  -Using ice packs in the first 48 hours and heating pads after 48 hours, and using over the counter medications are all ways to help with pain management.   -Simple acts like meditation and mindfulness practices after surgery can also help with pain  control and research has proven the benefit of these practices.  Medication: Take tylenol and ibuprofen as needed for pain control, alternating every 4-6 hours.  Example:  Tylenol 1000mg  @ 6am, 12noon, 6pm, 64midnight (Do not exceed 4000mg  of tylenol a day). Ibuprofen 800mg  @ 9am, 3pm, 9pm, 3am (Do not exceed 3600mg  of ibuprofen a day).  Take Roxicodone for breakthrough pain every 4 hours.  Take Colace for constipation related to narcotic pain medication. If you do not have a bowel movement in 2 days, take Miralax over the counter.  Drink plenty of water to also prevent constipation.   Contact Information: If you have questions or concerns, please call our office, [phone], Monday- Thursday 8AM-5PM and Friday 8AM-12Noon.  If it is after hours or on the weekend, please call [facility] Main Number, [phone], and ask to speak to the surgeon on call for Dr. Constance Haw at [facility].    Lipoma Removal, Care After This sheet gives you information about how to care for yourself after your procedure. Your health care provider may also give you more specific instructions. If you have problems or questions, contact your health care provider. What can I expect after the procedure? After the procedure, it is common to have:  Mild pain.  Swelling.  Bruising. Follow these instructions at home: Bathing   Do not take baths, swim, or use a hot tub until your health care provider approves. Ask your health care provider if you may take showers. You may only  be allowed to take sponge baths.  Keep your bandage (dressing) dry until your health care provider says it can be removed. Incision care   Follow instructions from your health care provider about how to take care of your incision. Make sure you: ? Wash your hands with soap and water for at least 20 seconds before and after you change your dressing. If soap and water are not available, use hand sanitizer. ? Change your dressing as told by your  health care provider. ? Leave stitches (sutures), skin glue, or adhesive strips in place. These skin closures may need to stay in place for 2 weeks or longer. If adhesive strip edges start to loosen and curl up, you may trim the loose edges. Do not remove adhesive strips completely unless your health care provider tells you to do that.  Check your incision area every day for signs of infection. Check for: ? More redness, swelling, or pain. ? Fluid or blood. ? Warmth. ? Pus or a bad smell. Medicines  Take over-the-counter and prescription medicines only as told by your health care provider.  If you were prescribed an antibiotic medicine, use it as told by your health care provider. Do not stop using the antibiotic even if you start to feel better. General instructions   If you were given a sedative during the procedure, it can affect you for several hours. Do not drive or operate machinery until your health care provider says that it is safe.  Do not use any products that contain nicotine or tobacco, such as cigarettes, e-cigarettes, and chewing tobacco. These can delay healing. If you need help quitting, ask your health care provider.  Return to your normal activities as told by your health care provider. Ask your health care provider what activities are safe for you.  Keep all follow-up visits as told by your health care provider. This is important. Contact a health care provider if:  You have more redness, swelling, or pain around your incision.  You have fluid or blood coming from your incision.  Your incision feels warm to the touch.  You have pus or a bad smell coming from your incision.  You have pain that does not get better with medicine. Get help right away if:  You have chills or a fever.  You have severe pain. Summary  After the procedure, it is common to have mild pain, swelling, and bruising.  Follow instructions from your health care provider about how to take  care of your incision.  Check your incision area every day for signs of infection.  Contact a health care provider if you have more redness, swelling, or pain around your incision. This information is not intended to replace advice given to you by your health care provider. Make sure you discuss any questions you have with your health care provider. Document Revised: 03/17/2019 Document Reviewed: 03/17/2019 Elsevier Patient Education  [place].

## 2019-11-19 NOTE — Op Note (Signed)
[facility] Operative Note  11/19/19  Preoperative Diagnosis: Lipoma on right back    Postoperative Diagnosis: Same   Procedure(s) Performed: Excision of lipoma (8cm)   Surgeon: Lanell Matar. Constance Haw, MD   Assistants: No qualified resident was available    Anesthesia: Monitored anesthesia care and lidocaine    Anesthesiologist: Denese Killings, MD    Specimens: Lipoma    Estimated Blood Loss: Minimal   Blood Replacement: None    Complications: None   Wound Class: Clean    Operative Indications:  Mr. Koyanagi is a 64 yo with a lipoma on his right back that has been causing him discomfort and soreness. He has lost weight and it is getting larger he thinks or at least more obvious. We discussed the risk of excision including but not limited to bleeding, infection, recurrence, and he has opted to proceed.   Findings: Large lipoma just above the muscle    Procedure: The patient was taken to the operating room and placed in the left lateral decubitus position on a bean bag with all pressure points padded. Monitored anesthesia care with sedation was induced. Intravenous antibiotics were administered per protocol.  The right back was prepared and draped in the usual sterile fashion.   An incision was made over the lipoma in the right lower back and carried down through the subcutaneous tissue and normal fat layer. A large lipoma was identified and was dissected out with a combination of blunt and cautery dissection. The lipoma measured over 8 cm in size and had some finger projections.  The cavity was made hemostatic. The cavity was closed with 3-0 Vicryl and th skin was closed with 3-0 Nylon interrupted suture given the location on the back and movement.  A sterile gauze and tegaderm were placed over the incision.   Final inspection revealed acceptable hemostasis. All counts were correct at the end of the case. The patient was awakened from anesthesia without  complication.  The patient went to the PACU in stable condition.   Curlene Labrum, MD [facility] [address] [place], [place] [address] [phone] (office)

## 2019-11-19 NOTE — Anesthesia Postprocedure Evaluation (Signed)
Anesthesia Post Note  Patient: Devin Santiago  Procedure(s) Performed: EXCISION LIPOMA, 5cm, back, right (Right Back)  Patient location during evaluation: PACU Anesthesia Type: MAC Level of consciousness: awake Pain management: pain level controlled Vital Signs Assessment: post-procedure vital signs reviewed and stable Respiratory status: spontaneous breathing Cardiovascular status: stable Postop Assessment: no apparent nausea or vomiting Anesthetic complications: no     Last Vitals:  Vitals:   11/19/19 0717 11/19/19 0720  BP: 134/78   Pulse: 74   Resp: 18 (!) 24  Temp: 36.6 C   SpO2: 98% 98%    Last Pain:  Vitals:   11/19/19 0717  TempSrc: Oral  PainSc: 0-No pain                 Rozlynn Lippold Hristova

## 2019-11-19 NOTE — Interval H&P Note (Signed)
History and Physical Interval Note:  11/19/2019 7:14 AM  Devin Santiago  has presented today for surgery, with the diagnosis of 5cm Lipoma, back, right.  The various methods of treatment have been discussed with the patient and family. After consideration of risks, benefits and other options for treatment, the patient has consented to  Procedure(s): EXCISION LIPOMA, 5cm, back, right (Right) as a surgical intervention.  The patient's history has been reviewed, patient examined, no change in status, stable for surgery.  I have reviewed the patient's chart and labs.  Questions were answered to the patient's satisfaction.    Area marked. No changes.   Virl Cagey

## 2019-11-19 NOTE — Anesthesia Preprocedure Evaluation (Addendum)
Anesthesia Evaluation  Patient identified by MRN, date of birth, ID band Patient awake    Reviewed: Allergy & Precautions, NPO status , Patient's Chart, lab work & pertinent test results  History of Anesthesia Complications Negative for: history of anesthetic complications  Airway Mallampati: II  TM Distance: >3 FB Neck ROM: Full    Dental  (+) Missing, Dental Advisory Given   Pulmonary former smoker,    Pulmonary exam normal breath sounds clear to auscultation       Cardiovascular Exercise Tolerance: Good hypertension, Pt. on medications Normal cardiovascular exam Rhythm:Regular Rate:Normal  17-Nov-2019 12:58:15 [facility]AP[facility]300 ROUTINE RECORD Normal sinus rhythm Normal ECG No previous tracing Confirmed by Kirk Ruths [phone]) on 11/17/2019 5:25:42 PM   Neuro/Psych negative neurological ROS  negative psych ROS   GI/Hepatic GERD  Medicated and Controlled,(+)     substance abuse  alcohol use,   Endo/Other  negative endocrine ROS  Renal/GU negative Renal ROS     Musculoskeletal negative musculoskeletal ROS (+)   Abdominal   Peds  Hematology negative hematology ROS (+)   Anesthesia Other Findings   Reproductive/Obstetrics negative OB ROS                          Anesthesia Physical Anesthesia Plan  ASA: II  Anesthesia Plan: MAC   Post-op Pain Management:    Induction: Intravenous  PONV Risk Score and Plan: 1 and Ondansetron  Airway Management Planned: Nasal Cannula, Natural Airway and Simple Face Mask  Additional Equipment:   Intra-op Plan:   Post-operative Plan:   Informed Consent: I have reviewed the patients History and Physical, chart, labs and discussed the procedure including the risks, benefits and alternatives for the proposed anesthesia with the patient or authorized representative who has indicated his/her understanding and acceptance.      Dental advisory given  Plan Discussed with: CRNA and Surgeon  Anesthesia Plan Comments:        Anesthesia Quick Evaluation

## 2019-11-20 LAB — SURGICAL PATHOLOGY

## 2019-12-04 ENCOUNTER — Other Ambulatory Visit: Payer: Self-pay

## 2019-12-04 ENCOUNTER — Encounter: Payer: Self-pay | Admitting: General Surgery

## 2019-12-04 ENCOUNTER — Ambulatory Visit (INDEPENDENT_AMBULATORY_CARE_PROVIDER_SITE_OTHER): Payer: Self-pay | Admitting: General Surgery

## 2019-12-04 VITALS — BP 144/88 | HR 79 | Temp 98.2°F | Resp 12 | Ht 66.0 in | Wt 204.0 lb

## 2019-12-04 DIAGNOSIS — D171 Benign lipomatous neoplasm of skin and subcutaneous tissue of trunk: Secondary | ICD-10-CM

## 2019-12-04 NOTE — Progress Notes (Signed)
[facility] Note   HPI:  64 y.o. Male presents to clinic for post-op follow-up evaluation after lipoma removal. He is doing well. Patient reports no pain or drainage.  Review of Systems:  No redness  No drainage All other review of systems: otherwise negative   Vital Signs:  BP (!) 144/88   Pulse 79   Temp 98.2 F (36.8 C) (Oral)   Resp 12   Ht 5\' 6"  (1.676 m)   Wt 204 lb (92.5 kg)   SpO2 94%   BMI 32.93 kg/m    Physical Exam:  Physical Exam Pulmonary:     Effort: Pulmonary effort is normal.  Musculoskeletal:     Comments: Left back sutures removed, no erythema or drainage, incision healed, steri strips applied     Pathology: FINAL MICROSCOPIC DIAGNOSIS:   A. LIPOMA ON RIGHT LOWER BACK, EXCISION:  - Mature fibroadipose tissue, clinically lipoma.  Assessment:  64 y.o. yo Male with excision of lipoma from his back. Doing well.  Plan:  Activity as tolerated  PRN follow up  Curlene Labrum, MD [facility] [address] [place], [place] [address] [phone] (office)

## 2019-12-11 ENCOUNTER — Ambulatory Visit: Attending: Internal Medicine

## 2019-12-11 DIAGNOSIS — Z23 Encounter for immunization: Secondary | ICD-10-CM

## 2019-12-11 NOTE — Progress Notes (Signed)
   Covid-19 Vaccination Clinic  Name:  Devin Santiago    MRN: [id number] DOB: 11-23-1955  12/11/2019  [name] was observed post Covid-19 immunization for 15 minutes without incident. He was provided with Vaccine Information Sheet and instruction to access the V-Safe system.  * Patient left/refused to wait 15 minutes in observation post vaccination.    [name] was instructed to call 911 with any severe reactions post vaccine: Instructions given by vaccine administrators prior to vaccine.  . Difficulty breathing  . Swelling of face and throat  . A fast heartbeat  . A bad rash all over body  . Dizziness and weakness   Immunizations Administered    Name Date Dose VIS Date Route   Moderna COVID-19 Vaccine 12/11/2019  8:13 AM 0.5 mL 07/2019 Intramuscular   Manufacturer: Moderna   Lot: [id number]   [id number]

## 2020-01-01 ENCOUNTER — Other Ambulatory Visit: Payer: Self-pay | Admitting: Family Medicine

## 2020-01-01 DIAGNOSIS — I10 Essential (primary) hypertension: Secondary | ICD-10-CM

## 2020-01-01 DIAGNOSIS — G4700 Insomnia, unspecified: Secondary | ICD-10-CM

## 2020-01-01 MED ORDER — AMLODIPINE BESYLATE 5 MG PO TABS: 5.0000 mg | ORAL_TABLET | Freq: Every day | ORAL | 0 refills | Status: DC

## 2020-01-05 ENCOUNTER — Telehealth: Payer: Self-pay | Admitting: Family Medicine

## 2020-01-05 NOTE — Telephone Encounter (Signed)
  Prescription Request  01/05/2020  What is the name of the medication or equipment? Testosterone 10 MG/ACT (2%) GEL  Have you contacted your pharmacy to request a refill? (if applicable) no  Which pharmacy would you like this sent to? [facility]   Patient notified that their request is being sent to the clinical staff for review and that they should receive a response within 2 business days.

## 2020-01-05 NOTE — Telephone Encounter (Signed)
Spoke to pt and advised he would ntbs for refills on his testosterone and scheduled appt with Dr Warrick Parisian 01/21/20 at 8:55.

## 2020-01-08 ENCOUNTER — Ambulatory Visit: Attending: Internal Medicine

## 2020-01-08 ENCOUNTER — Other Ambulatory Visit: Payer: Self-pay

## 2020-01-08 DIAGNOSIS — Z23 Encounter for immunization: Secondary | ICD-10-CM

## 2020-01-08 NOTE — Progress Notes (Signed)
   Covid-19 Vaccination Clinic  Name:  Devin Santiago    MRN: [id number] DOB: Jan 17, 1956  01/08/2020  [name] was observed post Covid-19 immunization for 15 minutes without incident. He was provided with Vaccine Information Sheet and instruction to access the V-Safe system.   [name] was instructed to call 911 with any severe reactions post vaccine: Marland Kitchen Difficulty breathing  . Swelling of face and throat  . A fast heartbeat  . A bad rash all over body  . Dizziness and weakness   Immunizations Administered    Name Date Dose VIS Date Route   Moderna COVID-19 Vaccine 01/08/2020  9:20 AM 0.5 mL 07/2019 Intramuscular   Manufacturer: Moderna   Lot: [id number]   [place]:[id number]

## 2020-01-21 ENCOUNTER — Other Ambulatory Visit: Payer: Self-pay

## 2020-01-21 ENCOUNTER — Encounter: Payer: Self-pay | Admitting: Family Medicine

## 2020-01-21 ENCOUNTER — Ambulatory Visit (INDEPENDENT_AMBULATORY_CARE_PROVIDER_SITE_OTHER): Admitting: Family Medicine

## 2020-01-21 VITALS — BP 134/77 | HR 73 | Temp 98.1°F | Ht 66.0 in | Wt 199.0 lb

## 2020-01-21 DIAGNOSIS — K219 Gastro-esophageal reflux disease without esophagitis: Secondary | ICD-10-CM | POA: Diagnosis not present

## 2020-01-21 DIAGNOSIS — N529 Male erectile dysfunction, unspecified: Secondary | ICD-10-CM | POA: Diagnosis not present

## 2020-01-21 DIAGNOSIS — I10 Essential (primary) hypertension: Secondary | ICD-10-CM | POA: Diagnosis not present

## 2020-01-21 DIAGNOSIS — E349 Endocrine disorder, unspecified: Secondary | ICD-10-CM | POA: Diagnosis not present

## 2020-01-21 DIAGNOSIS — Z7184 Encounter for health counseling related to travel: Secondary | ICD-10-CM

## 2020-01-21 DIAGNOSIS — Z13220 Encounter for screening for lipoid disorders: Secondary | ICD-10-CM

## 2020-01-21 MED ORDER — SILDENAFIL CITRATE 100 MG PO TABS: 50.0000 mg | ORAL_TABLET | Freq: Every day | ORAL | 5 refills | Status: DC | PRN

## 2020-01-21 MED ORDER — LISINOPRIL 40 MG PO TABS: 40.0000 mg | ORAL_TABLET | Freq: Every day | ORAL | 3 refills | Status: DC

## 2020-01-21 MED ORDER — TESTOSTERONE 10 MG/ACT (2%) TD GEL: 20.0000 mg | Freq: Every day | TRANSDERMAL | 1 refills | Status: DC

## 2020-01-21 MED ORDER — OMEPRAZOLE 40 MG PO CPDR: 40.0000 mg | DELAYED_RELEASE_CAPSULE | Freq: Every day | ORAL | 3 refills | Status: DC

## 2020-01-21 MED ORDER — AZITHROMYCIN 250 MG PO TABS: ORAL_TABLET | ORAL | 0 refills | Status: DC

## 2020-01-21 MED ORDER — AMLODIPINE BESYLATE 5 MG PO TABS: 5.0000 mg | ORAL_TABLET | Freq: Every day | ORAL | 3 refills | Status: DC

## 2020-01-21 NOTE — Progress Notes (Signed)
BP 134/77   Pulse 73   Temp 98.1 F (36.7 C) (Temporal)   Ht 5' 6"  (1.676 m)   Wt 199 lb (90.3 kg)   BMI 32.12 kg/m    Subjective:   Patient ID: Devin Santiago, male    DOB: 19-Dec-1955, 64 y.o.   MRN: [id number]  HPI: Devin Santiago is a 64 y.o. male presenting on 01/21/2020 for Medical Management of Chronic Issues   HPI Hypertension Patient is currently on amlodipine and lisinopril, and their blood pressure today is 134/77. Patient denies any lightheadedness or dizziness. Patient denies headaches, blurred vision, chest pains, shortness of breath, or weakness. Denies any side effects from medication and is content with current medication.   Erectile dysfunction and testosterone deficiency Patient is coming in for recheck of erectile dysfunction and testosterone deficiency.  He currently uses Viagra as needed and takes the testosterone and seems to be doing well with it.  Relevant past medical, surgical, family and social history reviewed and updated as indicated. Interim medical history since our last visit reviewed. Allergies and medications reviewed and updated.  Review of Systems  Constitutional: Negative for chills and fever.  Eyes: Negative for visual disturbance.  Respiratory: Negative for shortness of breath and wheezing.   Cardiovascular: Negative for chest pain and leg swelling.  Musculoskeletal: Negative for back pain and gait problem.  Skin: Negative for rash.  Neurological: Negative for dizziness, weakness and numbness.  All other systems reviewed and are negative.   Per HPI unless specifically indicated above   Allergies as of 01/21/2020   No Known Allergies     Medication List       Accurate as of January 21, 2020  9:45 AM. If you have any questions, ask your nurse or doctor.        STOP taking these medications   ondansetron 4 MG tablet Commonly known as: Zofran Stopped by: Fransisca Kaufmann Jerine Surles, MD     TAKE these medications   amLODipine 5 MG  tablet Commonly known as: NORVASC Take 1 tablet (5 mg total) by mouth daily.   ascorbic acid 500 MG tablet Commonly known as: VITAMIN C Take 500 mg by mouth daily.   azithromycin 250 MG tablet Commonly known as: ZITHROMAX Take 2 the first day and then one each day after. Started by: Fransisca Kaufmann Asante Ritacco, MD   docusate sodium 100 MG capsule Commonly known as: Colace Take 1 capsule (100 mg total) by mouth 2 (two) times daily.   ketoconazole 2 % cream Commonly known as: NIZORAL Apply 1 application topically daily. What changed:   when to take this  reasons to take this   lisinopril 40 MG tablet Commonly known as: ZESTRIL Take 1 tablet (40 mg total) by mouth daily.   multivitamin with minerals Tabs tablet Take 1 tablet by mouth daily.   omeprazole 40 MG capsule Commonly known as: PRILOSEC Take 1 capsule (40 mg total) by mouth daily.   Potassium 99 MG Tabs Take 99 mg by mouth daily.   sildenafil 100 MG tablet Commonly known as: Viagra Take 0.5-1 tablets (50-100 mg total) by mouth daily as needed for erectile dysfunction.   Testosterone 10 MG/ACT (2%) Gel Apply 20 mg topically daily.   traZODone 50 MG tablet Commonly known as: DESYREL TAKE 1 TABLET AT BEDTIME AS NEEDED FOR SLEEP        Objective:   BP 134/77   Pulse 73   Temp 98.1 F (36.7 C) (Temporal)   Ht 5'  6" (1.676 m)   Wt 199 lb (90.3 kg)   BMI 32.12 kg/m   Wt Readings from Last 3 Encounters:  01/21/20 199 lb (90.3 kg)  12/04/19 204 lb (92.5 kg)  11/19/19 195 lb (88.5 kg)    Physical Exam Vitals and nursing note reviewed.  Constitutional:      General: He is not in acute distress.    Appearance: He is well-developed. He is not diaphoretic.  Eyes:     General: No scleral icterus.    Conjunctiva/sclera: Conjunctivae normal.  Neck:     Thyroid: No thyromegaly.  Cardiovascular:     Rate and Rhythm: Normal rate and regular rhythm.     Heart sounds: Normal heart sounds. No murmur.   Pulmonary:     Effort: Pulmonary effort is normal. No respiratory distress.     Breath sounds: Normal breath sounds. No wheezing.  Musculoskeletal:        General: Normal range of motion.     Cervical back: Neck supple.  Lymphadenopathy:     Cervical: No cervical adenopathy.  Skin:    General: Skin is warm and dry.     Findings: No rash.  Neurological:     Mental Status: He is alert and oriented to person, place, and time.     Coordination: Coordination normal.  Psychiatric:        Behavior: Behavior normal.      Assessment & Plan:   Problem List Items Addressed This Visit      Cardiovascular and Mediastinum   Essential hypertension   Relevant Medications   sildenafil (VIAGRA) 100 MG tablet   amLODipine (NORVASC) 5 MG tablet   lisinopril (ZESTRIL) 40 MG tablet   Other Relevant Orders   CMP14+EGFR     Other   Erectile dysfunction   Relevant Medications   sildenafil (VIAGRA) 100 MG tablet   Testosterone deficiency - Primary   Relevant Medications   Testosterone 10 MG/ACT (2%) GEL   Other Relevant Orders   Testosterone,Free and Total    Other Visit Diagnoses    Gastroesophageal reflux disease       Relevant Medications   omeprazole (PRILOSEC) 40 MG capsule   Other Relevant Orders   CBC with Differential/Platelet   Travel advice encounter       Lipid screening       Relevant Orders   Lipid panel      Sent refill for medication, continue testosterone and amlodipine and sildenafil and lisinopril.  We will recheck blood work, recently had a surgery and had some blood work but not a full panel done so we will recheck some today. Follow up plan: Return in about 6 months (around 07/22/2020), or if symptoms worsen or fail to improve, for Hypertension and GERD.  Counseling provided for all of the vaccine components Orders Placed This Encounter  Procedures  . Testosterone,Free and Total  . CBC with Differential/Platelet  . CMP14+EGFR  . Lipid panel    Caryl Pina, MD [facility] 01/21/2020, 9:45 AM

## 2020-01-22 ENCOUNTER — Telehealth: Payer: Self-pay | Admitting: Family Medicine

## 2020-01-22 ENCOUNTER — Other Ambulatory Visit: Payer: Self-pay | Admitting: *Deleted

## 2020-01-22 MED ORDER — AZITHROMYCIN 250 MG PO TABS: ORAL_TABLET | ORAL | 0 refills | Status: DC

## 2020-01-22 NOTE — Telephone Encounter (Signed)
Patient aware, per message left, script is ready at [facility].  Cancelled script at [facility].

## 2020-01-22 NOTE — Telephone Encounter (Signed)
Pt called stating that he saw Dr Dettinger yesterday and an antibiotic was supposed to be sent to [facility] in [place], but Dr Dettinger sent it to [facility]. Requesting that he send to [facility] in [place] so that pt can pick up today.

## 2020-01-24 LAB — CMP14+EGFR
ALT: 28 IU/L (ref 0–44)
AST: 30 IU/L (ref 0–40)
Albumin/Globulin Ratio: 1.9 (ref 1.2–2.2)
Albumin: 4.4 g/dL (ref 3.8–4.8)
Alkaline Phosphatase: 62 IU/L (ref 48–121)
BUN/Creatinine Ratio: 15 (ref 10–24)
BUN: 21 mg/dL (ref 8–27)
Bilirubin Total: 0.3 mg/dL (ref 0.0–1.2)
CO2: 19 mmol/L — ABNORMAL LOW (ref 20–29)
Calcium: 9.5 mg/dL (ref 8.6–10.2)
Chloride: 105 mmol/L (ref 96–106)
Creatinine, Ser: 1.41 mg/dL — ABNORMAL HIGH (ref 0.76–1.27)
GFR calc Af Amer: 61 mL/min/{1.73_m2} (ref >59)
GFR calc non Af Amer: 53 mL/min/{1.73_m2} — ABNORMAL LOW (ref >59)
Globulin, Total: 2.3 g/dL (ref 1.5–4.5)
Glucose: 101 mg/dL — ABNORMAL HIGH (ref 65–99)
Potassium: 4.4 mmol/L (ref 3.5–5.2)
Sodium: 140 mmol/L (ref 134–144)
Total Protein: 6.7 g/dL (ref 6.0–8.5)

## 2020-01-24 LAB — CBC WITH DIFFERENTIAL/PLATELET
Basophils Absolute: 0.1 10*3/uL (ref 0.0–0.2)
Basos: 1 %
EOS (ABSOLUTE): 0.2 10*3/uL (ref 0.0–0.4)
Eos: 3 %
Hematocrit: 51.5 % — ABNORMAL HIGH (ref 37.5–51.0)
Hemoglobin: 17 g/dL (ref 13.0–17.7)
Immature Grans (Abs): 0 10*3/uL (ref 0.0–0.1)
Immature Granulocytes: 1 %
Lymphocytes Absolute: 2.1 10*3/uL (ref 0.7–3.1)
Lymphs: 29 %
MCH: 28.5 pg (ref 26.6–33.0)
MCHC: 33 g/dL (ref 31.5–35.7)
MCV: 86 fL (ref 79–97)
Monocytes Absolute: 0.6 10*3/uL (ref 0.1–0.9)
Monocytes: 8 %
Neutrophils Absolute: 4.2 10*3/uL (ref 1.4–7.0)
Neutrophils: 58 %
Platelets: 235 10*3/uL (ref 150–450)
RBC: 5.96 x10E6/uL — ABNORMAL HIGH (ref 4.14–5.80)
RDW: 12.7 % (ref 11.6–15.4)
WBC: 7.2 10*3/uL (ref 3.4–10.8)

## 2020-01-24 LAB — LIPID PANEL
Chol/HDL Ratio: 4.4 ratio (ref 0.0–5.0)
Cholesterol, Total: 141 mg/dL (ref 100–199)
HDL: 32 mg/dL — ABNORMAL LOW (ref >39)
LDL Chol Calc (NIH): 84 mg/dL (ref 0–99)
Triglycerides: 139 mg/dL (ref 0–149)
VLDL Cholesterol Cal: 25 mg/dL (ref 5–40)

## 2020-01-24 LAB — TESTOSTERONE,FREE AND TOTAL
Testosterone, Free: 1.9 pg/mL — ABNORMAL LOW (ref 6.6–18.1)
Testosterone: 38 ng/dL — ABNORMAL LOW (ref 264–916)

## 2020-01-26 ENCOUNTER — Telehealth: Payer: Self-pay | Admitting: Family Medicine

## 2020-01-26 NOTE — Telephone Encounter (Signed)
Patient had labs done 01/21/2020 and would like results.  Please review and advise.

## 2020-01-27 ENCOUNTER — Encounter: Payer: Self-pay | Admitting: Family Medicine

## 2020-06-15 ENCOUNTER — Ambulatory Visit (INDEPENDENT_AMBULATORY_CARE_PROVIDER_SITE_OTHER)

## 2020-06-15 ENCOUNTER — Other Ambulatory Visit: Payer: Self-pay

## 2020-06-15 DIAGNOSIS — Z23 Encounter for immunization: Secondary | ICD-10-CM

## 2020-08-19 ENCOUNTER — Other Ambulatory Visit: Payer: Self-pay

## 2020-08-19 ENCOUNTER — Ambulatory Visit (INDEPENDENT_AMBULATORY_CARE_PROVIDER_SITE_OTHER): Admitting: Family Medicine

## 2020-08-19 ENCOUNTER — Encounter: Payer: Self-pay | Admitting: Family Medicine

## 2020-08-19 VITALS — BP 132/79 | HR 73 | Ht 66.0 in | Wt 207.0 lb

## 2020-08-19 DIAGNOSIS — Z0001 Encounter for general adult medical examination with abnormal findings: Secondary | ICD-10-CM | POA: Diagnosis not present

## 2020-08-19 DIAGNOSIS — G4700 Insomnia, unspecified: Secondary | ICD-10-CM

## 2020-08-19 DIAGNOSIS — Z0000 Encounter for general adult medical examination without abnormal findings: Secondary | ICD-10-CM

## 2020-08-19 DIAGNOSIS — E349 Endocrine disorder, unspecified: Secondary | ICD-10-CM

## 2020-08-19 DIAGNOSIS — K219 Gastro-esophageal reflux disease without esophagitis: Secondary | ICD-10-CM

## 2020-08-19 DIAGNOSIS — N529 Male erectile dysfunction, unspecified: Secondary | ICD-10-CM

## 2020-08-19 DIAGNOSIS — Z125 Encounter for screening for malignant neoplasm of prostate: Secondary | ICD-10-CM

## 2020-08-19 DIAGNOSIS — I10 Essential (primary) hypertension: Secondary | ICD-10-CM

## 2020-08-19 MED ORDER — OMEPRAZOLE 40 MG PO CPDR: 40.0000 mg | DELAYED_RELEASE_CAPSULE | Freq: Every day | ORAL | 3 refills | Status: DC

## 2020-08-19 MED ORDER — TESTOSTERONE 10 MG/ACT (2%) TD GEL
20.0000 mg | Freq: Every day | TRANSDERMAL | 1 refills | Status: DC
Start: 2020-08-19 — End: 2021-07-01

## 2020-08-19 MED ORDER — AMLODIPINE BESYLATE 5 MG PO TABS: 5.0000 mg | ORAL_TABLET | Freq: Every day | ORAL | 3 refills | Status: DC

## 2020-08-19 MED ORDER — TRAZODONE HCL 50 MG PO TABS
50.0000 mg | ORAL_TABLET | Freq: Every evening | ORAL | 3 refills | Status: DC | PRN
Start: 2020-08-19 — End: 2021-08-25

## 2020-08-19 MED ORDER — LISINOPRIL 40 MG PO TABS: 40.0000 mg | ORAL_TABLET | Freq: Every day | ORAL | 3 refills | Status: DC

## 2020-08-19 MED ORDER — SILDENAFIL CITRATE 100 MG PO TABS: 50.0000 mg | ORAL_TABLET | Freq: Every day | ORAL | 5 refills | Status: DC | PRN

## 2020-08-19 NOTE — Progress Notes (Signed)
Vitals: BP 132/79, HR 73, SPO2 95, Wt 207lbs  HPI: Pt is a 29 y male here for a routine physical and medication refill. Pt states that he has been actively doing cardio in the mornings before work every day as well as cutting back on sodium intake and drinking 16oz water daily. Pt expresses some concern for recent weight gain despite trying to be more active and eat healthier. He denies unintentional weight loss, fevers, chills, night sweats, changes in vision, hearing, chest pain, shortness of breath, changes in bowel habits, appetite, abdominal discomfort. myalgias, weakness, swelling in his extremities. Pt would like his cholesterol check as well as PSA. Patient does admit that he was drinking a lot of alcohol including 4-6 drinks a day.  Hypertension Patient is currently on lisinopril and amlodipine, and their blood pressure today is 132/79. Patient denies any lightheadedness or dizziness. Patient denies headaches, blurred vision, chest pains, shortness of breath, or weakness. Denies any side effects from medication and is content with current medication.   Erectile dysfunction and testosterone deficiency recheck Patient is coming in for recheck of erectile dysfunction and testosterone deficiency. He currently takes testosterone gel for this and says it feels like it is working well.  Insomnia Patient is having some difficulty sleeping and needs a refill on the trazodone.  PMH: HTN, GERD SH: was drinking 6 pack of beer daily but has been trying to cut it out. Lives with his wife and is sexually active.  Medications Lisinopril Amlodipine Omeprazole Sildenafil Testosterone Trazodone Zinc gluconate Vitamin C Multivitamin Vitamin C  ROS: Constitutional: denies fevers, chills, unintentional weight loss HEENT: denies changes in vision, hearing, pain, or difficulty swallowing Cardiac: denies chain pain, discomfort, tachycardia, or palpitations Pulm: denies difficulty breathing or  SOB GI: denies abdominal pain, N/V/D, constipation, or changes in appetite GU: denies painful urination, urgency. Endorses increased frequency attributed to increased fluid intake.Denies feelings of hernia. MSK: denies myalgias, weakness, or peripheral swelling Neuro: denies headache or dizziness Pysch: denies changes mood or increase anxiety  PE General: well appearing and in no acute distress HEENT: TM clear and pearly gray, PERRLA, conjunctival injection, throat is normal Cardiac: RRR, no m/r/g Pulm: CTA bilat GI: soft, nontender, nondistended GU: no evidence of hernia MSK: muscle strength intact, no peripheral edema Neuro: normal gait Pysch: positive mood and affect  Labs PSA CMP CBC Lipid panel  Assessment Hypertension Routine physical  Pt appears to be in overall good health. No immediate concerns. Pt is managing his hypertension and making an effort to change diet and lifestyle.  Plan Pt will receive routine blood work to evaluate electrolytes, lipids, BG, and blood counts.  Pt encouraged to continue lifestyle changes and try to cut back on alcohol consumption  Damaris Hippo 08/19/2020  Problem List Items Addressed This Visit      Cardiovascular and Mediastinum   Essential hypertension   Relevant Medications   amLODipine (NORVASC) 5 MG tablet   lisinopril (ZESTRIL) 40 MG tablet   sildenafil (VIAGRA) 100 MG tablet   Other Relevant Orders   CMP14+EGFR (Completed)   Lipid panel (Completed)     Other   Erectile dysfunction   Relevant Medications   sildenafil (VIAGRA) 100 MG tablet   Testosterone deficiency   Relevant Medications   Testosterone 10 MG/ACT (2%) GEL   Insomnia   Relevant Medications   traZODone (DESYREL) 50 MG tablet    Other Visit Diagnoses    Well adult exam    -  Primary  Gastroesophageal reflux disease       Relevant Medications   omeprazole (PRILOSEC) 40 MG capsule   Other Relevant Orders   CBC with Differential/Platelet (Completed)    Prostate cancer screening       Relevant Orders   PSA, total and free (Completed)       Patient seen and examined with Damaris Hippo, PA student, agree with assessment and plan above. Continue current medication, no changes, focus on diet and lifestyle modification. Arville Care, MD [facility] 08/24/2020, 10:16 PM

## 2020-08-20 LAB — CMP14+EGFR
ALT: 32 IU/L (ref 0–44)
AST: 32 IU/L (ref 0–40)
Albumin/Globulin Ratio: 1.7 (ref 1.2–2.2)
Albumin: 4.8 g/dL (ref 3.8–4.8)
Alkaline Phosphatase: 73 IU/L (ref 44–121)
BUN/Creatinine Ratio: 18 (ref 10–24)
BUN: 27 mg/dL (ref 8–27)
Bilirubin Total: 0.4 mg/dL (ref 0.0–1.2)
CO2: 20 mmol/L (ref 20–29)
Calcium: 9.8 mg/dL (ref 8.6–10.2)
Chloride: 103 mmol/L (ref 96–106)
Creatinine, Ser: 1.49 mg/dL — ABNORMAL HIGH (ref 0.76–1.27)
GFR calc Af Amer: 57 mL/min/{1.73_m2} — ABNORMAL LOW (ref >59)
GFR calc non Af Amer: 49 mL/min/{1.73_m2} — ABNORMAL LOW (ref >59)
Globulin, Total: 2.8 g/dL (ref 1.5–4.5)
Glucose: 85 mg/dL (ref 65–99)
Potassium: 4.8 mmol/L (ref 3.5–5.2)
Sodium: 138 mmol/L (ref 134–144)
Total Protein: 7.6 g/dL (ref 6.0–8.5)

## 2020-08-20 LAB — PSA, TOTAL AND FREE
PSA, Free Pct: 33.8 %
PSA, Free: 0.27 ng/mL
Prostate Specific Ag, Serum: 0.8 ng/mL (ref 0.0–4.0)

## 2020-08-20 LAB — CBC WITH DIFFERENTIAL/PLATELET
Basophils Absolute: 0.1 10*3/uL (ref 0.0–0.2)
Basos: 1 %
EOS (ABSOLUTE): 0.2 10*3/uL (ref 0.0–0.4)
Eos: 3 %
Hematocrit: 52.3 % — ABNORMAL HIGH (ref 37.5–51.0)
Hemoglobin: 17.6 g/dL (ref 13.0–17.7)
Immature Grans (Abs): 0 10*3/uL (ref 0.0–0.1)
Immature Granulocytes: 0 %
Lymphocytes Absolute: 2 10*3/uL (ref 0.7–3.1)
Lymphs: 23 %
MCH: 28.6 pg (ref 26.6–33.0)
MCHC: 33.7 g/dL (ref 31.5–35.7)
MCV: 85 fL (ref 79–97)
Monocytes Absolute: 0.6 10*3/uL (ref 0.1–0.9)
Monocytes: 7 %
Neutrophils Absolute: 5.8 10*3/uL (ref 1.4–7.0)
Neutrophils: 66 %
Platelets: 251 10*3/uL (ref 150–450)
RBC: 6.15 x10E6/uL — ABNORMAL HIGH (ref 4.14–5.80)
RDW: 12.8 % (ref 11.6–15.4)
WBC: 8.8 10*3/uL (ref 3.4–10.8)

## 2020-08-20 LAB — LIPID PANEL
Chol/HDL Ratio: 4.3 ratio (ref 0.0–5.0)
Cholesterol, Total: 116 mg/dL (ref 100–199)
HDL: 27 mg/dL — ABNORMAL LOW (ref >39)
LDL Chol Calc (NIH): 67 mg/dL (ref 0–99)
Triglycerides: 117 mg/dL (ref 0–149)
VLDL Cholesterol Cal: 22 mg/dL (ref 5–40)

## 2021-04-07 ENCOUNTER — Encounter (HOSPITAL_BASED_OUTPATIENT_CLINIC_OR_DEPARTMENT_OTHER): Payer: Self-pay

## 2021-04-07 DIAGNOSIS — G4733 Obstructive sleep apnea (adult) (pediatric): Secondary | ICD-10-CM

## 2021-04-28 ENCOUNTER — Ambulatory Visit: Attending: Family Medicine | Admitting: Neurology

## 2021-04-28 ENCOUNTER — Other Ambulatory Visit: Payer: Self-pay

## 2021-04-28 DIAGNOSIS — G4733 Obstructive sleep apnea (adult) (pediatric): Secondary | ICD-10-CM | POA: Insufficient documentation

## 2021-05-09 NOTE — Procedures (Signed)
Harrietta A. Merlene Laughter, MD     [email or web address]             NOCTURNAL POLYSOMNOGRAPHY   LOCATION: [facility]   Patient Name: Devin Santiago, Devin Santiago Date: 04/28/2021 Gender: Male D.O.B: 12/04/1955 Age (years): 28 Referring Provider: Phillips Odor MD, ABSM Height (inches): 66 Interpreting Physician: Phillips Odor MD, ABSM Weight (lbs): 207 RPSGT: Peak, Robert BMI: 33 MRN: [id number] Neck Size: 18.00 CLINICAL INFORMATION Sleep Study Type: Split Night CPAP     Indication for sleep study: N/A     Epworth Sleepiness Score: 7  SLEEP STUDY TECHNIQUE As per the AASM Manual for the Scoring of Sleep and Associated Events v2.3 (April 2016) with a hypopnea requiring 4% desaturations.  The channels recorded and monitored were frontal, central and occipital EEG, electrooculogram (EOG), submentalis EMG (chin), nasal and oral airflow, thoracic and abdominal wall motion, anterior tibialis EMG, snore microphone, electrocardiogram, and pulse oximetry. Continuous positive airway pressure (CPAP) was initiated when the patient met split night criteria and was titrated according to treat sleep-disordered breathing.  MEDICATIONS Medications self-administered by patient taken the night of the study : N/A  Current Outpatient Medications:    amLODipine (NORVASC) 5 MG tablet, Take 1 tablet (5 mg total) by mouth daily., Disp: 90 tablet, Rfl: 3   ascorbic acid (VITAMIN C) 500 MG tablet, Take 500 mg by mouth daily., Disp: , Rfl:    lisinopril (ZESTRIL) 40 MG tablet, Take 1 tablet (40 mg total) by mouth daily., Disp: 90 tablet, Rfl: 3   Multiple Vitamin (MULTIVITAMIN WITH MINERALS) TABS tablet, Take 1 tablet by mouth daily., Disp: , Rfl:    omeprazole (PRILOSEC) 40 MG capsule, Take 1 capsule (40 mg total) by mouth daily., Disp: 90 capsule, Rfl: 3   Potassium 99 MG TABS, Take 99 mg by mouth daily., Disp: , Rfl:    sildenafil (VIAGRA) 100 MG tablet, Take 0.5-1 tablets (50-100 mg  total) by mouth daily as needed for erectile dysfunction., Disp: 30 tablet, Rfl: 5   Testosterone 10 MG/ACT (2%) GEL, Apply 20 mg topically daily., Disp: 180 g, Rfl: 1   traZODone (DESYREL) 50 MG tablet, Take 1 tablet (50 mg total) by mouth at bedtime as needed. for sleep, Disp: 90 tablet, Rfl: 3   zinc gluconate 50 MG tablet, Take 50 mg by mouth daily., Disp: , Rfl:    RESPIRATORY PARAMETERS Diagnostic  Total AHI (/hr): 36.1 RDI (/hr): 39.0 OA Index (/hr): 7.7 CA Index (/hr): 1.6 REM AHI (/hr): 41.4 NREM AHI (/hr): 35.7 Supine AHI (/hr): 77.2 Non-supine AHI (/hr): 30.5 Min O2 Sat (%): 77.00 Mean O2 (%): 92.75 Time below 88% (min): 7.4   Titration  Optimal Pressure (cm):  AHI at Optimal Pressure (/hr): N/A Min O2 at Optimal Pressure (%): 81.00 Supine % at Optimal (%): N/A Sleep % at Optimal (%): N/A   SLEEP ARCHITECTURE The recording time for the entire night was 376.9 minutes.  During a baseline period of 224.4 minutes, the patient slept for 185.9 minutes in REM and nonREM, yielding a sleep efficiency of 82.9%. Sleep onset after lights out was 5.5 minutes with a REM latency of 132.0 minutes. The patient spent 14.48% of the night in stage N1 sleep, 76.37% in stage N2 sleep, 1.34% in stage N3 and 7.8% in REM.     During the titration period of 150.7 minutes, the patient slept for 82.5 minutes in REM and nonREM, yielding a sleep efficiency of 54.7%. Sleep onset after CPAP initiation was 8.3 minutes  with a REM latency of 131.5 minutes. The patient spent 36.36% of the night in stage N1 sleep, 55.15% in stage N2 sleep, 0.00% in stage N3 and 8.5% in REM.  CARDIAC DATA The 2 lead EKG demonstrated sinus rhythm. The mean heart rate was 58.86 beats per minute. Other EKG findings include: None.  LEG MOVEMENT DATA The total Periodic Limb Movements of Sleep (PLMS) were 0. The PLMS index was 0.00.  IMPRESSIONS Severe obstructive sleep apnea occurred during the diagnostic portion of the study (AHI =  36.1/hour). The patient was placed on both CPAP and BiPAP but had difficulties tolerating positive pressure. I still recommend a trial of positive-pressure in the form of AutoPAP 8-12.    Delano Metz, MD Diplomate, American Board of Sleep Medicine.  ELECTRONICALLY SIGNED ON:  05/09/2021, 4:06 PM [place] PH: [phone]   FX: [phone] ACCREDITED BY THE AMERICAN ACADEMY OF SLEEP MEDICINE

## 2021-06-12 ENCOUNTER — Other Ambulatory Visit: Payer: Self-pay | Admitting: Family Medicine

## 2021-06-12 DIAGNOSIS — E349 Endocrine disorder, unspecified: Secondary | ICD-10-CM

## 2021-06-13 ENCOUNTER — Encounter: Payer: Self-pay | Admitting: Family Medicine

## 2021-06-13 NOTE — Telephone Encounter (Signed)
Needs appointment, missed last appointment

## 2021-07-01 ENCOUNTER — Ambulatory Visit (INDEPENDENT_AMBULATORY_CARE_PROVIDER_SITE_OTHER): Admitting: Family Medicine

## 2021-07-01 ENCOUNTER — Encounter: Payer: Self-pay | Admitting: Family Medicine

## 2021-07-01 ENCOUNTER — Other Ambulatory Visit: Payer: Self-pay

## 2021-07-01 VITALS — BP 135/69 | HR 79 | Ht 66.0 in | Wt 206.0 lb

## 2021-07-01 DIAGNOSIS — E349 Endocrine disorder, unspecified: Secondary | ICD-10-CM | POA: Diagnosis not present

## 2021-07-01 DIAGNOSIS — R7303 Prediabetes: Secondary | ICD-10-CM

## 2021-07-01 DIAGNOSIS — Z13220 Encounter for screening for lipoid disorders: Secondary | ICD-10-CM | POA: Diagnosis not present

## 2021-07-01 DIAGNOSIS — I10 Essential (primary) hypertension: Secondary | ICD-10-CM | POA: Diagnosis not present

## 2021-07-01 DIAGNOSIS — N529 Male erectile dysfunction, unspecified: Secondary | ICD-10-CM

## 2021-07-01 LAB — BAYER DCA HB A1C WAIVED: HB A1C (BAYER DCA - WAIVED): 6 % — ABNORMAL HIGH (ref 4.8–5.6)

## 2021-07-01 MED ORDER — TESTOSTERONE 10 MG/ACT (2%) TD GEL: 20.0000 mg | Freq: Every day | TRANSDERMAL | 1 refills | Status: DC

## 2021-07-01 MED ORDER — SILDENAFIL CITRATE 100 MG PO TABS: 50.0000 mg | ORAL_TABLET | Freq: Every day | ORAL | 5 refills | Status: DC | PRN

## 2021-07-01 NOTE — Addendum Note (Signed)
Addended by: Caryl Pina on: 07/01/2021 01:38 PM   Modules accepted: Orders

## 2021-07-01 NOTE — Progress Notes (Signed)
BP 135/69   Pulse 79   Ht 5\' 6"  (1.676 m)   Wt 206 lb (93.4 kg)   SpO2 96%   BMI 33.25 kg/m    Subjective:   Patient ID: Devin Santiago, male    DOB: 1956/07/31, 65 y.o.   MRN: [id number]  HPI: Devin Santiago is a 65 y.o. male presenting on 07/01/2021 for Medical Management of Chronic Issues, Hypertension, and testosterone deficiency   HPI Hypertension Patient is currently on amlodipine and lisinopril, and their blood pressure today is 135/69. Patient denies any lightheadedness or dizziness. Patient denies headaches, blurred vision, chest pains, shortness of breath, or weakness. Denies any side effects from medication and is content with current medication.   Prediabetes Patient comes in today for recheck of his diabetes. Patient has been currently taking no medication currently, focus on diet. Patient is currently on an ACE inhibitor/ARB. Patient has not seen an ophthalmologist this year. Patient denies any issues with their feet. The symptom started onset as an adult hypertension ARE RELATED TO DM   Testosterone deficiency and erectile dysfunction Patient is coming in for recheck for testosterone deficiency and erectile dysfunction.  He is taking testosterone and did get tested through the [place] a few months ago and the levels look good on the current testosterone dose.  Continue current dose.  He does use Viagra as needed and it works well.  Relevant past medical, surgical, family and social history reviewed and updated as indicated. Interim medical history since our last visit reviewed. Allergies and medications reviewed and updated.  Review of Systems  Constitutional:  Negative for chills and fever.  Eyes:  Negative for visual disturbance.  Respiratory:  Negative for shortness of breath and wheezing.   Cardiovascular:  Negative for chest pain and leg swelling.  Musculoskeletal:  Negative for back pain and gait problem.  Skin:  Negative for rash.  Neurological:  Negative for  dizziness, weakness and numbness.  All other systems reviewed and are negative.  Per HPI unless specifically indicated above   Allergies as of 07/01/2021   No Known Allergies      Medication List        Accurate as of July 01, 2021  1:31 PM. If you have any questions, ask your nurse or doctor.          amLODipine 5 MG tablet Commonly known as: NORVASC Take 1 tablet (5 mg total) by mouth daily.   ascorbic acid 500 MG tablet Commonly known as: VITAMIN C Take 500 mg by mouth daily.   lisinopril 40 MG tablet Commonly known as: ZESTRIL Take 1 tablet (40 mg total) by mouth daily.   multivitamin with minerals Tabs tablet Take 1 tablet by mouth daily.   omeprazole 40 MG capsule Commonly known as: PRILOSEC Take 1 capsule (40 mg total) by mouth daily.   Potassium 99 MG Tabs Take 99 mg by mouth daily.   sildenafil 100 MG tablet Commonly known as: Viagra Take 0.5-1 tablets (50-100 mg total) by mouth daily as needed for erectile dysfunction.   Testosterone 10 MG/ACT (2%) Gel Apply 20 mg topically daily.   traZODone 50 MG tablet Commonly known as: DESYREL Take 1 tablet (50 mg total) by mouth at bedtime as needed. for sleep   zinc gluconate 50 MG tablet Take 50 mg by mouth daily.         Objective:   BP 135/69   Pulse 79   Ht 5\' 6"  (1.676 m)   Wt 206  lb (93.4 kg)   SpO2 96%   BMI 33.25 kg/m   Wt Readings from Last 3 Encounters:  07/01/21 206 lb (93.4 kg)  08/19/20 207 lb (93.9 kg)  01/21/20 199 lb (90.3 kg)    Physical Exam Vitals and nursing note reviewed.  Constitutional:      General: He is not in acute distress.    Appearance: He is well-developed. He is not diaphoretic.  Eyes:     General: No scleral icterus.    Conjunctiva/sclera: Conjunctivae normal.  Neck:     Thyroid: No thyromegaly.  Cardiovascular:     Rate and Rhythm: Normal rate and regular rhythm.     Heart sounds: Normal heart sounds. No murmur heard. Pulmonary:     Effort:  Pulmonary effort is normal. No respiratory distress.     Breath sounds: Normal breath sounds. No wheezing.  Musculoskeletal:        General: Normal range of motion.     Cervical back: Neck supple.  Lymphadenopathy:     Cervical: No cervical adenopathy.  Skin:    General: Skin is warm and dry.     Findings: No rash.  Neurological:     Mental Status: He is alert and oriented to person, place, and time.     Coordination: Coordination normal.  Psychiatric:        Behavior: Behavior normal.      Assessment & Plan:   Problem List Items Addressed This Visit       Cardiovascular and Mediastinum   Essential hypertension - Primary   Relevant Medications   sildenafil (VIAGRA) 100 MG tablet     Other   Testosterone deficiency   Relevant Medications   Testosterone 10 MG/ACT (2%) GEL   Erectile dysfunction   Relevant Medications   sildenafil (VIAGRA) 100 MG tablet   Prediabetes   Other Visit Diagnoses     Lipid screening           Patient had blood work done through the [place] in August which is scanned into his chart, the only thing off on the blood work was that his A1c was up slightly showing prediabetes. Follow up plan: Return in about 6 months (around 12/29/2021), or if symptoms worsen or fail to improve, for Testosterone and prediabetes and hypertension recheck.  Counseling provided for all of the vaccine components No orders of the defined types were placed in this encounter.   Caryl Pina, MD [facility] 07/01/2021, 1:31 PM

## 2021-07-13 ENCOUNTER — Other Ambulatory Visit

## 2021-07-13 ENCOUNTER — Other Ambulatory Visit: Payer: Self-pay

## 2021-07-13 DIAGNOSIS — R7303 Prediabetes: Secondary | ICD-10-CM

## 2021-07-14 ENCOUNTER — Encounter: Payer: Self-pay | Admitting: Family Medicine

## 2021-07-14 DIAGNOSIS — I10 Essential (primary) hypertension: Secondary | ICD-10-CM

## 2021-07-14 DIAGNOSIS — R7303 Prediabetes: Secondary | ICD-10-CM

## 2021-07-14 DIAGNOSIS — Z125 Encounter for screening for malignant neoplasm of prostate: Secondary | ICD-10-CM

## 2021-07-14 DIAGNOSIS — E349 Endocrine disorder, unspecified: Secondary | ICD-10-CM

## 2021-07-14 MED ORDER — TESTOSTERONE 10 MG/ACT (2%) TD GEL: 20.0000 mg | Freq: Every day | TRANSDERMAL | 1 refills | Status: DC

## 2021-07-14 NOTE — Addendum Note (Signed)
Addended by: Caryl Pina on: 07/14/2021 12:45 PM   Modules accepted: Orders

## 2021-07-15 ENCOUNTER — Ambulatory Visit: Admitting: Family Medicine

## 2021-07-15 ENCOUNTER — Other Ambulatory Visit

## 2021-07-15 DIAGNOSIS — R7303 Prediabetes: Secondary | ICD-10-CM

## 2021-07-15 DIAGNOSIS — E349 Endocrine disorder, unspecified: Secondary | ICD-10-CM

## 2021-07-15 DIAGNOSIS — Z125 Encounter for screening for malignant neoplasm of prostate: Secondary | ICD-10-CM

## 2021-07-15 DIAGNOSIS — I10 Essential (primary) hypertension: Secondary | ICD-10-CM

## 2021-07-15 LAB — BAYER DCA HB A1C WAIVED: HB A1C (BAYER DCA - WAIVED): 6.1 % — ABNORMAL HIGH (ref 4.8–5.6)

## 2021-07-18 LAB — CBC WITH DIFFERENTIAL/PLATELET
Basophils Absolute: 0 10*3/uL (ref 0.0–0.2)
Basos: 1 %
EOS (ABSOLUTE): 0.3 10*3/uL (ref 0.0–0.4)
Eos: 4 %
Hematocrit: 52.2 % — ABNORMAL HIGH (ref 37.5–51.0)
Hemoglobin: 17.4 g/dL (ref 13.0–17.7)
Immature Grans (Abs): 0 10*3/uL (ref 0.0–0.1)
Immature Granulocytes: 0 %
Lymphocytes Absolute: 1.8 10*3/uL (ref 0.7–3.1)
Lymphs: 31 %
MCH: 28.8 pg (ref 26.6–33.0)
MCHC: 33.3 g/dL (ref 31.5–35.7)
MCV: 86 fL (ref 79–97)
Monocytes Absolute: 0.7 10*3/uL (ref 0.1–0.9)
Monocytes: 11 %
Neutrophils Absolute: 3.1 10*3/uL (ref 1.4–7.0)
Neutrophils: 53 %
Platelets: 214 10*3/uL (ref 150–450)
RBC: 6.04 x10E6/uL — ABNORMAL HIGH (ref 4.14–5.80)
RDW: 12.5 % (ref 11.6–15.4)
WBC: 5.9 10*3/uL (ref 3.4–10.8)

## 2021-07-18 LAB — CMP14+EGFR
ALT: 30 IU/L (ref 0–44)
AST: 31 IU/L (ref 0–40)
Albumin/Globulin Ratio: 1.8 (ref 1.2–2.2)
Albumin: 4.3 g/dL (ref 3.8–4.8)
Alkaline Phosphatase: 59 IU/L (ref 44–121)
BUN/Creatinine Ratio: 11 (ref 10–24)
BUN: 17 mg/dL (ref 8–27)
Bilirubin Total: 0.4 mg/dL (ref 0.0–1.2)
CO2: 24 mmol/L (ref 20–29)
Calcium: 9.5 mg/dL (ref 8.6–10.2)
Chloride: 103 mmol/L (ref 96–106)
Creatinine, Ser: 1.54 mg/dL — ABNORMAL HIGH (ref 0.76–1.27)
Globulin, Total: 2.4 g/dL (ref 1.5–4.5)
Glucose: 107 mg/dL — ABNORMAL HIGH (ref 70–99)
Potassium: 4.5 mmol/L (ref 3.5–5.2)
Sodium: 141 mmol/L (ref 134–144)
Total Protein: 6.7 g/dL (ref 6.0–8.5)
eGFR: 50 mL/min/{1.73_m2} — ABNORMAL LOW (ref >59)

## 2021-07-18 LAB — LIPID PANEL
Chol/HDL Ratio: 4.9 ratio (ref 0.0–5.0)
Cholesterol, Total: 103 mg/dL (ref 100–199)
HDL: 21 mg/dL — ABNORMAL LOW (ref >39)
LDL Chol Calc (NIH): 47 mg/dL (ref 0–99)
Triglycerides: 219 mg/dL — ABNORMAL HIGH (ref 0–149)
VLDL Cholesterol Cal: 35 mg/dL (ref 5–40)

## 2021-07-18 LAB — PSA, TOTAL AND FREE
PSA, Free Pct: 36.3 %
PSA, Free: 0.29 ng/mL
Prostate Specific Ag, Serum: 0.8 ng/mL (ref 0.0–4.0)

## 2021-07-18 LAB — TESTOSTERONE,FREE AND TOTAL
Testosterone, Free: 13.9 pg/mL (ref 6.6–18.1)
Testosterone: 757 ng/dL (ref 264–916)

## 2021-07-29 ENCOUNTER — Other Ambulatory Visit: Payer: Self-pay | Admitting: *Deleted

## 2021-07-29 DIAGNOSIS — I10 Essential (primary) hypertension: Secondary | ICD-10-CM

## 2021-07-29 DIAGNOSIS — K219 Gastro-esophageal reflux disease without esophagitis: Secondary | ICD-10-CM

## 2021-07-29 MED ORDER — LISINOPRIL 40 MG PO TABS: 40.0000 mg | ORAL_TABLET | Freq: Every day | ORAL | 1 refills | Status: DC

## 2021-07-29 MED ORDER — OMEPRAZOLE 40 MG PO CPDR: 40.0000 mg | DELAYED_RELEASE_CAPSULE | Freq: Every day | ORAL | 1 refills | Status: DC

## 2021-07-29 MED ORDER — AMLODIPINE BESYLATE 5 MG PO TABS: 5.0000 mg | ORAL_TABLET | Freq: Every day | ORAL | 1 refills | Status: DC

## 2021-08-25 ENCOUNTER — Encounter: Payer: Self-pay | Admitting: Family Medicine

## 2021-08-25 ENCOUNTER — Ambulatory Visit (INDEPENDENT_AMBULATORY_CARE_PROVIDER_SITE_OTHER): Admitting: Family Medicine

## 2021-08-25 VITALS — BP 160/83 | HR 63 | Ht 66.0 in | Wt 219.0 lb

## 2021-08-25 DIAGNOSIS — G4700 Insomnia, unspecified: Secondary | ICD-10-CM

## 2021-08-25 DIAGNOSIS — I10 Essential (primary) hypertension: Secondary | ICD-10-CM

## 2021-08-25 DIAGNOSIS — R7303 Prediabetes: Secondary | ICD-10-CM

## 2021-08-25 MED ORDER — TRAZODONE HCL 50 MG PO TABS: 50.0000 mg | ORAL_TABLET | Freq: Every evening | ORAL | 3 refills | Status: DC | PRN

## 2021-08-25 MED ORDER — OZEMPIC (0.25 OR 0.5 MG/DOSE) 2 MG/1.5ML ~~LOC~~ SOPN: 0.5000 mg | PEN_INJECTOR | SUBCUTANEOUS | 1 refills | Status: DC

## 2021-08-25 NOTE — Progress Notes (Signed)
BP (!) 160/83    Pulse 63    Ht 5' 6"  (1.676 m)    Wt 219 lb (99.3 kg)    SpO2 95%    BMI 35.35 kg/m    Subjective:   Patient ID: Devin Santiago, male    DOB: 29-Nov-1955, 66 y.o.   MRN: [id number]  HPI: Devin Santiago is a 66 y.o. male presenting on 08/25/2021 for Medical Management of Chronic Issues, Hypertension, and testosteronce deficiency   HPI Hypertension Patient is currently on amlodipine and lisinopril, and their blood pressure today is 160/83, he says it was 138/76 yesterday at home. Patient denies any lightheadedness or dizziness. Patient denies headaches, blurred vision, chest pains, shortness of breath, or weakness. Denies any side effects from medication and is content with current medication.   Prediabetes Patient comes in today for recheck of his diabetes. Patient has been currently taking no medication, A1c 6.1 which is slightly increased. Patient is currently on an ACE inhibitor/ARB. Patient has not seen an ophthalmologist this year. Patient denies any issues with their feet. The symptom started onset as an adult hypertension ARE RELATED TO DM.  Patient says has been gaining weight even though he is trying to change his diet and exercise would like to try something to help with that.  Testosterone deficiency recheck Patient continues supplementation for testosterone deficiency and seems to be doing well.  Insomnia Patient takes trazodone for insomnia  Relevant past medical, surgical, family and social history reviewed and updated as indicated. Interim medical history since our last visit reviewed. Allergies and medications reviewed and updated.  Review of Systems  Constitutional:  Positive for unexpected weight change. Negative for chills and fever.  Eyes:  Negative for visual disturbance.  Respiratory:  Negative for shortness of breath and wheezing.   Cardiovascular:  Negative for chest pain and leg swelling.  Musculoskeletal:  Negative for back pain and gait problem.   Skin:  Negative for rash.  Neurological:  Negative for dizziness, weakness and light-headedness.  All other systems reviewed and are negative.  Per HPI unless specifically indicated above   Allergies as of 08/25/2021   No Known Allergies      Medication List        Accurate as of August 25, 2021  1:25 PM. If you have any questions, ask your nurse or doctor.          amLODipine 5 MG tablet Commonly known as: NORVASC Take 1 tablet (5 mg total) by mouth daily.   ascorbic acid 500 MG tablet Commonly known as: VITAMIN C Take 500 mg by mouth daily.   lisinopril 40 MG tablet Commonly known as: ZESTRIL Take 1 tablet (40 mg total) by mouth daily.   multivitamin with minerals Tabs tablet Take 1 tablet by mouth daily.   omeprazole 40 MG capsule Commonly known as: PRILOSEC Take 1 capsule (40 mg total) by mouth daily.   Ozempic (0.25 or 0.5 MG/DOSE) 2 MG/1.5ML Sopn Generic drug: Semaglutide(0.25 or 0.5MG/DOS) Inject 0.5 mg into the skin once a week. Started by: Fransisca Kaufmann Michiah Masse, MD   Potassium 99 MG Tabs Take 99 mg by mouth daily.   sildenafil 100 MG tablet Commonly known as: Viagra Take 0.5-1 tablets (50-100 mg total) by mouth daily as needed for erectile dysfunction.   Testosterone 10 MG/ACT (2%) Gel Apply 20 mg topically daily.   traZODone 50 MG tablet Commonly known as: DESYREL Take 1 tablet (50 mg total) by mouth at bedtime as needed. for sleep  zinc gluconate 50 MG tablet Take 50 mg by mouth daily.         Objective:   BP (!) 160/83    Pulse 63    Ht 5' 6"  (1.676 m)    Wt 219 lb (99.3 kg)    SpO2 95%    BMI 35.35 kg/m   Wt Readings from Last 3 Encounters:  08/25/21 219 lb (99.3 kg)  07/01/21 206 lb (93.4 kg)  08/19/20 207 lb (93.9 kg)    Physical Exam Vitals and nursing note reviewed.  Constitutional:      General: He is not in acute distress.    Appearance: He is well-developed. He is not diaphoretic.  Eyes:     General: No scleral  icterus.    Conjunctiva/sclera: Conjunctivae normal.  Neck:     Thyroid: No thyromegaly.  Cardiovascular:     Rate and Rhythm: Normal rate and regular rhythm.     Heart sounds: Normal heart sounds. No murmur heard. Pulmonary:     Effort: Pulmonary effort is normal. No respiratory distress.     Breath sounds: Normal breath sounds. No wheezing.  Musculoskeletal:        General: Normal range of motion.     Cervical back: Neck supple.  Lymphadenopathy:     Cervical: No cervical adenopathy.  Skin:    General: Skin is warm and dry.     Findings: No rash.  Neurological:     Mental Status: He is alert and oriented to person, place, and time.     Coordination: Coordination normal.  Psychiatric:        Behavior: Behavior normal.    Results for orders placed or performed in visit on 07/15/21  Bayer DCA Hb A1c Waived  Result Value Ref Range   HB A1C (BAYER DCA - WAIVED) 6.1 (H) 4.8 - 5.6 %  CBC with Differential/Platelet  Result Value Ref Range   WBC 5.9 3.4 - 10.8 x10E3/uL   RBC 6.04 (H) 4.14 - 5.80 x10E6/uL   Hemoglobin 17.4 13.0 - 17.7 g/dL   Hematocrit 52.2 (H) 37.5 - 51.0 %   MCV 86 79 - 97 fL   MCH 28.8 26.6 - 33.0 pg   MCHC 33.3 31.5 - 35.7 g/dL   RDW 12.5 11.6 - 15.4 %   Platelets 214 150 - 450 x10E3/uL   Neutrophils 53 Not Estab. %   Lymphs 31 Not Estab. %   Monocytes 11 Not Estab. %   Eos 4 Not Estab. %   Basos 1 Not Estab. %   Neutrophils Absolute 3.1 1.4 - 7.0 x10E3/uL   Lymphocytes Absolute 1.8 0.7 - 3.1 x10E3/uL   Monocytes Absolute 0.7 0.1 - 0.9 x10E3/uL   EOS (ABSOLUTE) 0.3 0.0 - 0.4 x10E3/uL   Basophils Absolute 0.0 0.0 - 0.2 x10E3/uL   Immature Granulocytes 0 Not Estab. %   Immature Grans (Abs) 0.0 0.0 - 0.1 x10E3/uL  CMP14+EGFR  Result Value Ref Range   Glucose 107 (H) 70 - 99 mg/dL   BUN 17 8 - 27 mg/dL   Creatinine, Ser 1.54 (H) 0.76 - 1.27 mg/dL   eGFR 50 (L) >59 mL/min/1.73   BUN/Creatinine Ratio 11 10 - 24   Sodium 141 134 - 144 mmol/L    Potassium 4.5 3.5 - 5.2 mmol/L   Chloride 103 96 - 106 mmol/L   CO2 24 20 - 29 mmol/L   Calcium 9.5 8.6 - 10.2 mg/dL   Total Protein 6.7 6.0 - 8.5 g/dL  Albumin 4.3 3.8 - 4.8 g/dL   Globulin, Total 2.4 1.5 - 4.5 g/dL   Albumin/Globulin Ratio 1.8 1.2 - 2.2   Bilirubin Total 0.4 0.0 - 1.2 mg/dL   Alkaline Phosphatase 59 44 - 121 IU/L   AST 31 0 - 40 IU/L   ALT 30 0 - 44 IU/L  Lipid panel  Result Value Ref Range   Cholesterol, Total 103 100 - 199 mg/dL   Triglycerides 219 (H) 0 - 149 mg/dL   HDL 21 (L) >39 mg/dL   VLDL Cholesterol Cal 35 5 - 40 mg/dL   LDL Chol Calc (NIH) 47 0 - 99 mg/dL   Chol/HDL Ratio 4.9 0.0 - 5.0 ratio  PSA, total and free  Result Value Ref Range   Prostate Specific Ag, Serum 0.8 0.0 - 4.0 ng/mL   PSA, Free 0.29 N/A ng/mL   PSA, Free Pct 36.3 %  Testosterone,Free and Total  Result Value Ref Range   Testosterone 757 264 - 916 ng/dL   Testosterone, Free 13.9 6.6 - 18.1 pg/mL    Assessment & Plan:   Problem List Items Addressed This Visit       Cardiovascular and Mediastinum   Essential hypertension - Primary     Other   Insomnia   Relevant Medications   traZODone (DESYREL) 50 MG tablet   Prediabetes   Relevant Medications   Semaglutide,0.25 or 0.5MG/DOS, (OZEMPIC, 0.25 OR 0.5 MG/DOSE,) 2 MG/1.5ML SOPN  A1c slightly increased and his weight is going up and will try Ozempic to help with the diabetes and the weight  Continue other medicines.  No changes.  Blood work looked pretty decent except for slight increase in triglycerides  Follow up plan: No follow-ups on file.  Counseling provided for all of the vaccine components No orders of the defined types were placed in this encounter.   Caryl Pina, MD [facility] 08/25/2021, 1:25 PM

## 2021-09-06 ENCOUNTER — Encounter: Payer: Self-pay | Admitting: Family Medicine

## 2021-09-06 DIAGNOSIS — R7303 Prediabetes: Secondary | ICD-10-CM

## 2021-09-07 ENCOUNTER — Other Ambulatory Visit: Payer: Self-pay | Admitting: Family Medicine

## 2021-09-07 DIAGNOSIS — R7303 Prediabetes: Secondary | ICD-10-CM

## 2021-09-07 DIAGNOSIS — Z6832 Body mass index (BMI) 32.0-32.9, adult: Secondary | ICD-10-CM

## 2021-09-07 MED ORDER — OZEMPIC (0.25 OR 0.5 MG/DOSE) 2 MG/1.5ML ~~LOC~~ SOPN: 0.5000 mg | PEN_INJECTOR | SUBCUTANEOUS | 1 refills | Status: DC

## 2021-09-09 ENCOUNTER — Other Ambulatory Visit: Payer: Self-pay

## 2021-09-09 DIAGNOSIS — R7303 Prediabetes: Secondary | ICD-10-CM

## 2021-09-09 MED ORDER — OZEMPIC (0.25 OR 0.5 MG/DOSE) 2 MG/1.5ML ~~LOC~~ SOPN: 0.5000 mg | PEN_INJECTOR | SUBCUTANEOUS | 1 refills | Status: DC

## 2021-09-09 NOTE — Telephone Encounter (Signed)
Devin Santiago ([facility] [place]) Rx #: [id number] Ozempic (0.25 or 0.5 MG/DOSE) 2MG /1.5ML pen-injectors  Waiting for Question response

## 2021-09-09 NOTE — Telephone Encounter (Signed)
Wait for Determination Please wait for [facility] [employer] to return a determination. 

## 2021-09-09 NOTE — Telephone Encounter (Signed)
Note - Trulicity is covered for [employer]

## 2021-09-09 NOTE — Telephone Encounter (Signed)
BULAGT:[id number];ZYYQMG:NOIBBC;Review Type:;Appeal Information: Attention:ATTN: Marshall [id number]. WUGQB,VQ,[id number] CMKLK:[phone] VXY:[phone]; Important - Please read the below note on eAppeals: Please reference the denial letter for information on the rights for an appeal, rationale for the denial, and how to submit an appeal including if any information is needed to support the appeal. Note about urgent situations - Generally, an urgent situation is one which, in the opinion of the provider, the health of the patient may be in serious jeopardy or may experience pain that cannot be adequately controlled while waiting for a decision on the appeal.;  (See previous note about Trulicity )

## 2021-09-14 ENCOUNTER — Other Ambulatory Visit: Payer: Self-pay | Admitting: Family Medicine

## 2021-09-14 ENCOUNTER — Telehealth: Payer: Self-pay | Admitting: *Deleted

## 2021-09-14 DIAGNOSIS — R7303 Prediabetes: Secondary | ICD-10-CM

## 2021-09-14 MED ORDER — RYBELSUS 7 MG PO TABS
7.0000 mg | ORAL_TABLET | Freq: Every day | ORAL | 3 refills | Status: DC
Start: 2021-09-14 — End: 2021-10-20

## 2021-09-14 MED ORDER — RYBELSUS 3 MG PO TABS: 3.0000 mg | ORAL_TABLET | Freq: Every day | ORAL | 0 refills | Status: DC

## 2021-09-14 NOTE — Progress Notes (Signed)
Please let patient know that I sent in a prescription for Rybelsus, he is to take 3 mg for the first 2 weeks and then go up to 7 mg daily.  We will see if this is covered by his insurance.

## 2021-09-14 NOTE — Progress Notes (Signed)
Pt has been informed. He has no concerns at this time.

## 2021-09-14 NOTE — Telephone Encounter (Signed)
PA for Rybelsus 3mg - In Process  Key: [id number] - [id number]

## 2021-09-15 NOTE — Telephone Encounter (Signed)
Please let the patient know that this second medication that I sent was rejected as well.  If he does want to come in and try for the other medicine that does not help with diabetes but could help with weight it is controlled so would have to be during the visit so we can make a visit and we can discuss that and possibly prescribe it for short-term

## 2021-09-15 NOTE — Telephone Encounter (Signed)
Left message to call back  

## 2021-09-15 NOTE — Telephone Encounter (Signed)
Message from [place];Review Type:;Appeal Information: Attention:ATTN: Claude [id number]. HUDJS,HF,[id number] FOYDX:[phone] MCN:[phone]; Important - Please read the below note on eAppeals: Please reference the denial letter for information on the rights for an appeal, rationale for the denial, and how to submit an appeal including if any information is needed to support the appeal. Note about urgent situations - Generally, an urgent situation is one which, in the opinion of the provider, the health of the patient may be in serious jeopardy or may experience pain that cannot be adequately controlled while waiting for a decision on the appeal.;    FYI - we will delete the PA request for rybelsus 7 as well

## 2021-09-15 NOTE — Telephone Encounter (Signed)
PT R/C 

## 2021-09-20 NOTE — Telephone Encounter (Signed)
LMTCB

## 2021-09-21 ENCOUNTER — Encounter: Payer: Self-pay | Admitting: Family Medicine

## 2021-09-21 ENCOUNTER — Ambulatory Visit (INDEPENDENT_AMBULATORY_CARE_PROVIDER_SITE_OTHER): Admitting: Family Medicine

## 2021-09-21 MED ORDER — PHENTERMINE HCL 37.5 MG PO CAPS: 37.5000 mg | ORAL_CAPSULE | ORAL | 0 refills | Status: DC

## 2021-09-21 NOTE — Progress Notes (Signed)
BP (!) 150/72    Pulse 72    Ht 5\' 6"  (1.676 m)    Wt 226 lb (102.5 kg)    SpO2 97%    BMI 36.48 kg/m    Subjective:   Patient ID: Devin Santiago, male    DOB: 11/21/1955, 66 y.o.   MRN: [id number]  HPI: Devin Santiago is a 66 y.o. male presenting on 09/21/2021 for Discuss weight loss medication   HPI Obesity and weight loss discussion Patient is coming in for obesity and weight loss.  He says he still gaining weight and he has been trying to be active he still feels like he is gaining weight.  He does not know what to do to get it to come down.  He wants to try medicine but all of the medicines we have tried so far were not covered by his insurance.  Relevant past medical, surgical, family and social history reviewed and updated as indicated. Interim medical history since our last visit reviewed. Allergies and medications reviewed and updated.  Review of Systems  Constitutional:  Positive for unexpected weight change. Negative for chills and fever.  Eyes:  Negative for visual disturbance.  Respiratory:  Negative for shortness of breath and wheezing.   Cardiovascular:  Negative for chest pain and leg swelling.  Musculoskeletal:  Negative for back pain and gait problem.  Skin:  Negative for rash.  All other systems reviewed and are negative.  Per HPI unless specifically indicated above   Allergies as of 09/21/2021   No Known Allergies      Medication List        Accurate as of September 21, 2021  3:27 PM. If you have any questions, ask your nurse or doctor.          amLODipine 5 MG tablet Commonly known as: NORVASC Take 1 tablet (5 mg total) by mouth daily.   ascorbic acid 500 MG tablet Commonly known as: VITAMIN C Take 500 mg by mouth daily.   lisinopril 40 MG tablet Commonly known as: ZESTRIL Take 1 tablet (40 mg total) by mouth daily.   multivitamin with minerals Tabs tablet Take 1 tablet by mouth daily.   omeprazole 40 MG capsule Commonly known as: PRILOSEC Take  1 capsule (40 mg total) by mouth daily.   phentermine 37.5 MG capsule Take 1 capsule (37.5 mg total) by mouth every morning. Started by: Fransisca Kaufmann Adelai Achey, MD   Potassium 99 MG Tabs Take 99 mg by mouth daily.   Rybelsus 3 MG Tabs Generic drug: Semaglutide Take 3 mg by mouth daily.   Rybelsus 7 MG Tabs Generic drug: Semaglutide Take 7 mg by mouth daily.   sildenafil 100 MG tablet Commonly known as: Viagra Take 0.5-1 tablets (50-100 mg total) by mouth daily as needed for erectile dysfunction.   Testosterone 10 MG/ACT (2%) Gel Apply 20 mg topically daily.   traZODone 50 MG tablet Commonly known as: DESYREL Take 1 tablet (50 mg total) by mouth at bedtime as needed. for sleep   zinc gluconate 50 MG tablet Take 50 mg by mouth daily.         Objective:   BP (!) 150/72    Pulse 72    Ht 5\' 6"  (1.676 m)    Wt 226 lb (102.5 kg)    SpO2 97%    BMI 36.48 kg/m   Wt Readings from Last 3 Encounters:  09/21/21 226 lb (102.5 kg)  08/25/21 219 lb (99.3 kg)  07/01/21  206 lb (93.4 kg)    Physical Exam Vitals and nursing note reviewed.  Constitutional:      General: He is not in acute distress.    Appearance: He is well-developed. He is not diaphoretic.  Skin:    General: Skin is warm and dry.     Findings: No rash.  Neurological:     Mental Status: He is alert and oriented to person, place, and time.     Coordination: Coordination normal.  Psychiatric:        Behavior: Behavior normal.      Assessment & Plan:   Problem List Items Addressed This Visit   None Visit Diagnoses     Morbid obesity ([place])    -  Primary   Relevant Medications   phentermine 37.5 MG capsule       We will start phentermine and have come back in 4 weeks. Follow up plan: No follow-ups on file.  Counseling provided for all of the vaccine components No orders of the defined types were placed in this encounter.   Caryl Pina, MD [facility] 09/21/2021, 3:27  PM

## 2021-09-23 NOTE — Telephone Encounter (Signed)
;  patient came in for appt and discussed with [place]

## 2021-10-20 ENCOUNTER — Other Ambulatory Visit: Payer: Self-pay | Admitting: Family Medicine

## 2021-10-20 ENCOUNTER — Ambulatory Visit (INDEPENDENT_AMBULATORY_CARE_PROVIDER_SITE_OTHER): Admitting: Family Medicine

## 2021-10-20 ENCOUNTER — Encounter: Payer: Self-pay | Admitting: Family Medicine

## 2021-10-20 VITALS — BP 142/80 | HR 72 | Ht 66.0 in | Wt 214.0 lb

## 2021-10-20 DIAGNOSIS — R7303 Prediabetes: Secondary | ICD-10-CM

## 2021-10-20 DIAGNOSIS — I10 Essential (primary) hypertension: Secondary | ICD-10-CM | POA: Diagnosis not present

## 2021-10-20 DIAGNOSIS — K219 Gastro-esophageal reflux disease without esophagitis: Secondary | ICD-10-CM | POA: Diagnosis not present

## 2021-10-20 DIAGNOSIS — E349 Endocrine disorder, unspecified: Secondary | ICD-10-CM

## 2021-10-20 DIAGNOSIS — Z79899 Other long term (current) drug therapy: Secondary | ICD-10-CM | POA: Diagnosis not present

## 2021-10-20 DIAGNOSIS — Z6832 Body mass index (BMI) 32.0-32.9, adult: Secondary | ICD-10-CM

## 2021-10-20 MED ORDER — TESTOSTERONE 10 MG/ACT (2%) TD GEL: 20.0000 mg | Freq: Every day | TRANSDERMAL | 1 refills | Status: DC

## 2021-10-20 MED ORDER — LISINOPRIL 40 MG PO TABS: 40.0000 mg | ORAL_TABLET | Freq: Every day | ORAL | 3 refills | Status: AC

## 2021-10-20 MED ORDER — AMLODIPINE BESYLATE 5 MG PO TABS: 5.0000 mg | ORAL_TABLET | Freq: Every day | ORAL | 3 refills | Status: DC

## 2021-10-20 MED ORDER — PHENTERMINE HCL 37.5 MG PO CAPS: 37.5000 mg | ORAL_CAPSULE | ORAL | 0 refills | Status: DC

## 2021-10-20 MED ORDER — OMEPRAZOLE 40 MG PO CPDR: 40.0000 mg | DELAYED_RELEASE_CAPSULE | Freq: Every day | ORAL | 3 refills | Status: DC

## 2021-10-20 NOTE — Progress Notes (Signed)
? ?BP (!) 142/80   Pulse 72   Ht '5\' 6"'$  (1.676 m)   Wt 214 lb (97.1 kg)   SpO2 94%   BMI 34.54 kg/m?   ? ?Subjective:  ? ?Patient ID: Devin Santiago, male    DOB: Sep 12, 1955, 66 y.o.   MRN: [id number] ? ?HPI: ?Devin Santiago is a 66 y.o. male presenting on 10/20/2021 for Obesity (Weight check) ? ? ?HPI ?Testosterone deficiency recheck ?Current rx-testosterone gel 2%, 20 mg daily topically ?# meds rx-180 g ?Effectiveness of current meds-works well ?Adverse reactions form meds-none, keeping an eye on blood counts ? ?Pill count performed-No ?Last drug screen -N/A ?( high risk q65m moderate risk q63mlow risk yearly ) ?Urine drug screen today- Yes ?[place] ? If yes were their any concerning findings? -None ? ?No flowsheet data found. ? ? ?Controlled substance contract signed on: Today ? ?Obesity and weight recheck ?Patient is coming in for obesity and weight recheck.  He has been taking the phentermine for a month.  His weight is down 12 pounds.  He denies any side effects from medication. ? ?Hypertension ?Patient is currently on amlodipine and lisinopril, and their blood pressure today is 142/80. Patient denies any lightheadedness or dizziness. Patient denies headaches, blurred vision, chest pains, shortness of breath, or weakness. Denies any side effects from medication and is content with current medication.  ? ?Relevant past medical, surgical, family and social history reviewed and updated as indicated. Interim medical history since our last visit reviewed. ?Allergies and medications reviewed and updated. ? ?Review of Systems  ?Constitutional:  Negative for chills and fever.  ?Eyes:  Negative for visual disturbance.  ?Respiratory:  Negative for shortness of breath and wheezing.   ?Cardiovascular:  Negative for chest pain and leg swelling.  ?Musculoskeletal:  Negative for back pain and gait problem.  ?Skin:  Negative for rash.  ?Neurological:  Negative for dizziness, weakness and light-headedness.  ?All  other systems reviewed and are negative. ? ?Per HPI unless specifically indicated above ? ? ?Allergies as of 10/20/2021   ?No Known Allergies ?  ? ?  ?Medication List  ?  ? ?  ? Accurate as of October 20, 2021 10:11 AM. If you have any questions, ask your nurse or doctor.  ?  ?  ? ?  ? ?STOP taking these medications   ? ?Rybelsus 3 MG Tabs ?Generic drug: Semaglutide ?Stopped by: JoWorthy RancherMD ?  ?Rybelsus 7 MG Tabs ?Generic drug: Semaglutide ?Stopped by: JoWorthy RancherMD ?  ?traZODone 50 MG tablet ?Commonly known as: DESYREL ?Stopped by: JoWorthy RancherMD ?  ? ?  ? ?TAKE these medications   ? ?amLODipine 5 MG tablet ?Commonly known as: NORVASC ?Take 1 tablet (5 mg total) by mouth daily. ?  ?ascorbic acid 500 MG tablet ?Commonly known as: VITAMIN C ?Take 500 mg by mouth daily. ?  ?lisinopril 40 MG tablet ?Commonly known as: ZESTRIL ?Take 1 tablet (40 mg total) by mouth daily. ?  ?multivitamin with minerals Tabs tablet ?Take 1 tablet by mouth daily. ?  ?omeprazole 40 MG capsule ?Commonly known as: PRILOSEC ?Take 1 capsule (40 mg total) by mouth daily. ?  ?phentermine 37.5 MG capsule ?Take 1 capsule (37.5 mg total) by mouth every morning. ?  ?Potassium 99 MG Tabs ?Take 99 mg by mouth daily. ?  ?sildenafil 100 MG tablet ?Commonly known as: Viagra ?Take 0.5-1 tablets (50-100 mg total) by mouth daily as needed for erectile dysfunction. ?  ?  Testosterone 10 MG/ACT (2%) Gel ?Apply 20 mg topically daily. ?  ?zinc gluconate 50 MG tablet ?Take 50 mg by mouth daily. ?  ? ?  ? ? ? ?Objective:  ? ?BP (!) 142/80   Pulse 72   Ht '5\' 6"'$  (1.676 m)   Wt 214 lb (97.1 kg)   SpO2 94%   BMI 34.54 kg/m?   ?Wt Readings from Last 3 Encounters:  ?10/20/21 214 lb (97.1 kg)  ?09/21/21 226 lb (102.5 kg)  ?08/25/21 219 lb (99.3 kg)  ?  ?Physical Exam ?Vitals and nursing note reviewed.  ?Constitutional:   ?   General: He is not in acute distress. ?   Appearance: He is well-developed. He is not diaphoretic.  ?Eyes:  ?   General:  No scleral icterus. ?   Conjunctiva/sclera: Conjunctivae normal.  ?Neck:  ?   Thyroid: No thyromegaly.  ?Cardiovascular:  ?   Rate and Rhythm: Normal rate and regular rhythm.  ?   Heart sounds: Normal heart sounds. No murmur heard. ?Pulmonary:  ?   Effort: Pulmonary effort is normal. No respiratory distress.  ?   Breath sounds: Normal breath sounds. No wheezing.  ?Musculoskeletal:     ?   General: Normal range of motion.  ?   Cervical back: Neck supple.  ?Lymphadenopathy:  ?   Cervical: No cervical adenopathy.  ?Skin: ?   General: Skin is warm and dry.  ?   Findings: No rash.  ?Neurological:  ?   Mental Status: He is alert and oriented to person, place, and time.  ?   Coordination: Coordination normal.  ?Psychiatric:     ?   Behavior: Behavior normal.  ? ? ? ? ?Assessment & Plan:  ? ?Problem List Items Addressed This Visit   ? ?  ? Cardiovascular and Mediastinum  ? Essential hypertension  ? Relevant Medications  ? amLODipine (NORVASC) 5 MG tablet  ? lisinopril (ZESTRIL) 40 MG tablet  ?  ? Other  ? Testosterone deficiency  ? Relevant Medications  ? Testosterone 10 MG/ACT (2%) GEL  ? Other Relevant Orders  ? ToxASSURE Select 13 (MW), Urine  ? BMI 32.0-32.9,adult  ? Relevant Medications  ? phentermine 37.5 MG capsule  ? ?Other Visit Diagnoses   ? ? Controlled substance agreement signed    -  Primary  ? Relevant Medications  ? Testosterone 10 MG/ACT (2%) GEL  ? Other Relevant Orders  ? ToxASSURE Select 13 (MW), Urine  ? Morbid obesity ([place])      ? Relevant Medications  ? phentermine 37.5 MG capsule  ? Gastroesophageal reflux disease      ? Relevant Medications  ? omeprazole (PRILOSEC) 40 MG capsule  ? ?  ?Continue phentermine, will do another month.  Seems to be doing well with weight loss.  Keep an eye on the blood pressures. ? ?Follow up plan: ?Return in about 4 weeks (around 11/17/2021), or if symptoms worsen or fail to improve, for Obesity and weight recheck. ? ?Counseling provided for all of the vaccine  components ?Orders Placed This Encounter  ?Procedures  ? ToxASSURE Select 13 (MW), Urine  ? ? ?Caryl Pina, MD ?[place] ?10/20/2021, 10:11 AM ? ? ? ? ?

## 2021-10-25 LAB — TOXASSURE SELECT 13 (MW), URINE

## 2021-10-26 ENCOUNTER — Encounter: Payer: Self-pay | Admitting: Family Medicine

## 2021-11-17 ENCOUNTER — Ambulatory Visit: Admitting: Family Medicine

## 2021-12-30 ENCOUNTER — Telehealth: Payer: Self-pay | Admitting: Family Medicine

## 2021-12-30 ENCOUNTER — Ambulatory Visit: Admitting: Family Medicine

## 2021-12-30 ENCOUNTER — Encounter: Payer: Self-pay | Admitting: Family Medicine

## 2021-12-30 VITALS — BP 179/74 | HR 77 | Ht 66.0 in | Wt 217.0 lb

## 2021-12-30 DIAGNOSIS — K219 Gastro-esophageal reflux disease without esophagitis: Secondary | ICD-10-CM | POA: Diagnosis not present

## 2021-12-30 DIAGNOSIS — R7303 Prediabetes: Secondary | ICD-10-CM | POA: Diagnosis not present

## 2021-12-30 DIAGNOSIS — E349 Endocrine disorder, unspecified: Secondary | ICD-10-CM

## 2021-12-30 DIAGNOSIS — I10 Essential (primary) hypertension: Secondary | ICD-10-CM

## 2021-12-30 DIAGNOSIS — Z79899 Other long term (current) drug therapy: Secondary | ICD-10-CM

## 2021-12-30 DIAGNOSIS — N529 Male erectile dysfunction, unspecified: Secondary | ICD-10-CM

## 2021-12-30 LAB — BAYER DCA HB A1C WAIVED: HB A1C (BAYER DCA - WAIVED): 5.9 % — ABNORMAL HIGH (ref 4.8–5.6)

## 2021-12-30 MED ORDER — TESTOSTERONE 10 MG/ACT (2%) TD GEL: 20.0000 mg | Freq: Every day | TRANSDERMAL | 1 refills | Status: DC

## 2021-12-30 MED ORDER — SILDENAFIL CITRATE 100 MG PO TABS
50.0000 mg | ORAL_TABLET | Freq: Every day | ORAL | 5 refills | Status: DC | PRN
Start: 2021-12-30 — End: 2021-12-30

## 2021-12-30 MED ORDER — SILDENAFIL CITRATE 100 MG PO TABS: 50.0000 mg | ORAL_TABLET | Freq: Every day | ORAL | 1 refills | Status: AC | PRN

## 2021-12-30 NOTE — Addendum Note (Signed)
Addended by: Caryl Pina on: 12/30/2021 11:52 AM   Modules accepted: Orders

## 2021-12-30 NOTE — Addendum Note (Signed)
Addended by: Caryl Pina on: 12/30/2021 09:15 AM   Modules accepted: Orders

## 2021-12-30 NOTE — Addendum Note (Signed)
Addended by: Alphonzo Dublin on: 12/30/2021 08:49 AM   Modules accepted: Orders

## 2021-12-30 NOTE — Progress Notes (Signed)
BP (!) 179/74   Pulse 77   Ht _0  (1.676 m)   Wt 217 lb (98.4 kg)   SpO2 97%   BMI 35.02 kg/m    Subjective:   Patient ID: Devin Santiago, male    DOB: [birth year], 66 y.o.   MRN: [id number]  HPI: Devin Santiago is a 66 y.o. male presenting on 12/30/2021 for Medical Management of Chronic Issues, Hypertension, and testosterone deficiency   HPI Hypertension Patient is currently on amlodipine and lisinopril, and their blood pressure today is 180/79, home blood pressures are 114/61 consistently.. Patient denies any lightheadedness or dizziness. Patient denies headaches, blurred vision, chest pains, shortness of breath, or weakness. Denies any side effects from medication and is content with current medication.   Prediabetes  patient comes in today for recheck of his diabetes. Patient has been currently taking no medicine currently, diet controlled. Patient is currently on an ACE inhibitor/ARB. Patient has not seen an ophthalmologist this year. Patient denies any issues with their feet. The symptom started onset as an adult hypertension ARE RELATED TO DM  . Testosterone deficiency Patient is coming in for testosterone deficiency.  He has been using it but has been out of it for a month because the insurance has not had it and he wants [place] to send a refill.  GERD Patient is currently on omeprazole.  She denies any major symptoms or abdominal pain or belching or burping. She denies any blood in her stool or lightheadedness or dizziness.   Relevant past medical, surgical, family and social history reviewed and updated as indicated. Interim medical history since our last visit reviewed. Allergies and medications reviewed and updated.  Review of Systems  Constitutional:  Negative for chills and fever.  Eyes:  Negative for visual disturbance.  Respiratory:  Negative for shortness of breath and wheezing.   Cardiovascular:  Negative for chest pain and leg swelling.  Musculoskeletal:  Negative for  back pain and gait problem.  Skin:  Negative for rash.  Neurological:  Negative for dizziness, seizures and weakness.  All other systems reviewed and are negative.  Per HPI unless specifically indicated above   Allergies as of 12/30/2021   No Known Allergies      Medication List        Accurate as of Dec 30, 2021  8:31 AM. If you have any questions, ask your nurse or doctor.          STOP taking these medications    phentermine 37.5 MG capsule Stopped by: Fransisca Kaufmann Rohith Fauth, MD       TAKE these medications    amLODipine 5 MG tablet Commonly known as: NORVASC Take 1 tablet (5 mg total) by mouth daily.   ascorbic acid 500 MG tablet Commonly known as: VITAMIN C Take 500 mg by mouth daily.   lisinopril 40 MG tablet Commonly known as: ZESTRIL Take 1 tablet (40 mg total) by mouth daily.   multivitamin with minerals Tabs tablet Take 1 tablet by mouth daily.   omeprazole 40 MG capsule Commonly known as: PRILOSEC Take 1 capsule (40 mg total) by mouth daily.   Potassium 99 MG Tabs Take 99 mg by mouth daily.   sildenafil 100 MG tablet Commonly known as: Viagra Take 0.5-1 tablets (50-100 mg total) by mouth daily as needed for erectile dysfunction.   Testosterone 10 MG/ACT (2%) Gel Apply 20 mg topically daily.   zinc gluconate 50 MG tablet Take 50 mg by mouth daily.  Objective:   BP (!) 179/74   Pulse 77   Ht _0  (1.676 m)   Wt 217 lb (98.4 kg)   SpO2 97%   BMI 35.02 kg/m   Wt Readings from Last 3 Encounters:  12/30/21 217 lb (98.4 kg)  10/20/21 214 lb (97.1 kg)  09/21/21 226 lb (102.5 kg)    Physical Exam Vitals and nursing note reviewed.  Constitutional:      General: He is not in acute distress.    Appearance: He is well-developed. He is not diaphoretic.  Eyes:     General: No scleral icterus.    Conjunctiva/sclera: Conjunctivae normal.  Neck:     Thyroid: No thyromegaly.  Cardiovascular:     Rate and Rhythm: Normal rate and  regular rhythm.     Heart sounds: Normal heart sounds. No murmur heard. Pulmonary:     Effort: Pulmonary effort is normal. No respiratory distress.     Breath sounds: Normal breath sounds. No wheezing.  Musculoskeletal:        General: Normal range of motion.     Cervical back: Neck supple.  Lymphadenopathy:     Cervical: No cervical adenopathy.  Skin:    General: Skin is warm and dry.     Findings: No rash.  Neurological:     Mental Status: He is alert and oriented to person, place, and time.     Coordination: Coordination normal.  Psychiatric:        Behavior: Behavior normal.      Assessment & Plan:   Problem List Items Addressed This Visit       Cardiovascular and Mediastinum   Essential hypertension - Primary   Relevant Medications   sildenafil (VIAGRA) 100 MG tablet   Other Relevant Orders   CMP14+EGFR   Lipid panel     Digestive   GERD (gastroesophageal reflux disease)   Relevant Orders   CBC with Differential/Platelet   CMP14+EGFR   Lipid panel     Other   Erectile dysfunction   Relevant Medications   sildenafil (VIAGRA) 100 MG tablet   Prediabetes   Relevant Orders   CBC with Differential/Platelet   CMP14+EGFR   Lipid panel   Bayer DCA Hb A1c Waived   Testosterone deficiency   Relevant Medications   Testosterone 10 MG/ACT (2%) GEL   Other Visit Diagnoses     Controlled substance agreement signed       Relevant Medications   Testosterone 10 MG/ACT (2%) GEL       Continue current medicine, seems to be doing okay with it.  Continue exercise.  We will do blood work today. Follow up plan: Return in about 6 months (around 07/02/2022), or if symptoms worsen or fail to improve, for Prediabetes and hypertension.  Counseling provided for all of the vaccine components Orders Placed This Encounter  Procedures   CBC with Differential/Platelet   CMP14+EGFR   Lipid panel   Bayer DCA Hb A1c Waived    Caryl Pina, MD [facility] 12/30/2021, 8:31 AM

## 2021-12-31 LAB — CMP14+EGFR
ALT: 34 IU/L (ref 0–44)
AST: 37 IU/L (ref 0–40)
Albumin/Globulin Ratio: 1.6 (ref 1.2–2.2)
Albumin: 4.6 g/dL (ref 3.8–4.8)
Alkaline Phosphatase: 65 IU/L (ref 44–121)
BUN/Creatinine Ratio: 16 (ref 10–24)
BUN: 22 mg/dL (ref 8–27)
Bilirubin Total: 0.6 mg/dL (ref 0.0–1.2)
CO2: 20 mmol/L (ref 20–29)
Calcium: 9.6 mg/dL (ref 8.6–10.2)
Chloride: 105 mmol/L (ref 96–106)
Creatinine, Ser: 1.37 mg/dL — ABNORMAL HIGH (ref 0.76–1.27)
Globulin, Total: 2.9 g/dL (ref 1.5–4.5)
Glucose: 130 mg/dL — ABNORMAL HIGH (ref 70–99)
Potassium: 4.4 mmol/L (ref 3.5–5.2)
Sodium: 141 mmol/L (ref 134–144)
Total Protein: 7.5 g/dL (ref 6.0–8.5)
eGFR: 58 mL/min/{1.73_m2} — ABNORMAL LOW (ref >59)

## 2021-12-31 LAB — CBC WITH DIFFERENTIAL/PLATELET
Basophils Absolute: 0.1 10*3/uL (ref 0.0–0.2)
Basos: 1 %
EOS (ABSOLUTE): 0.2 10*3/uL (ref 0.0–0.4)
Eos: 3 %
Hematocrit: 53.5 % — ABNORMAL HIGH (ref 37.5–51.0)
Hemoglobin: 17.7 g/dL (ref 13.0–17.7)
Immature Grans (Abs): 0 10*3/uL (ref 0.0–0.1)
Immature Granulocytes: 0 %
Lymphocytes Absolute: 1.5 10*3/uL (ref 0.7–3.1)
Lymphs: 21 %
MCH: 28.7 pg (ref 26.6–33.0)
MCHC: 33.1 g/dL (ref 31.5–35.7)
MCV: 87 fL (ref 79–97)
Monocytes Absolute: 0.7 10*3/uL (ref 0.1–0.9)
Monocytes: 9 %
Neutrophils Absolute: 4.9 10*3/uL (ref 1.4–7.0)
Neutrophils: 66 %
Platelets: 217 10*3/uL (ref 150–450)
RBC: 6.17 x10E6/uL — ABNORMAL HIGH (ref 4.14–5.80)
RDW: 13.5 % (ref 11.6–15.4)
WBC: 7.4 10*3/uL (ref 3.4–10.8)

## 2021-12-31 LAB — LIPID PANEL
Chol/HDL Ratio: 5 ratio (ref 0.0–5.0)
Cholesterol, Total: 129 mg/dL (ref 100–199)
HDL: 26 mg/dL — ABNORMAL LOW (ref >39)
LDL Chol Calc (NIH): 57 mg/dL (ref 0–99)
Triglycerides: 289 mg/dL — ABNORMAL HIGH (ref 0–149)
VLDL Cholesterol Cal: 46 mg/dL — ABNORMAL HIGH (ref 5–40)

## 2022-02-23 ENCOUNTER — Ambulatory Visit (INDEPENDENT_AMBULATORY_CARE_PROVIDER_SITE_OTHER): Admitting: Family Medicine

## 2022-02-23 ENCOUNTER — Encounter: Payer: Self-pay | Admitting: Family Medicine

## 2022-02-23 VITALS — BP 156/83 | HR 71 | Temp 97.2°F | Resp 20 | Ht 66.0 in | Wt 223.0 lb

## 2022-02-23 DIAGNOSIS — H8302 Labyrinthitis, left ear: Secondary | ICD-10-CM | POA: Diagnosis not present

## 2022-02-23 DIAGNOSIS — R0789 Other chest pain: Secondary | ICD-10-CM

## 2022-02-23 MED ORDER — PREDNISONE 20 MG PO TABS: ORAL_TABLET | ORAL | 0 refills | Status: DC

## 2022-02-23 NOTE — Progress Notes (Signed)
BP (!) 156/83   Pulse 71   Temp (!) 97.2 F (36.2 C) (Temporal)   Resp 20   Ht '5\' 6"'$  (1.676 m)   Wt 223 lb (101.2 kg)   SpO2 94%   BMI 35.99 kg/m    Subjective:   Patient ID: Devin Santiago, male    DOB: [birth year], 66 y.o.   MRN: [id number]  HPI: Devin Santiago is a 66 y.o. male presenting on 02/23/2022 for off balance (3-4 days/) and Chest Pain   HPI Chest pain and feeling off balance Patient is coming in with complaints of chest pain and feeling off balance was going on about the last 3 to 4 days.  He says a little chest pain is left-sided is very sharp and only lasts a few seconds when it comes this happened a few times over the past couple days.  Says he is also got some tingling and numbness of his right arm but he has had before but it has been worse recently.  Is also had some congestion and chest congestion and drainage and then he just feels off balance like he is leaning towards the left side when he gets up and walks around.  He says it is finally sitting but mainly when he is up and moving around he feels it.  He says that off balance has been more persistent over the past 3 to 4 days but that chest discomfort was only intermittent and lasted a few seconds.  He denies any chest pain on exertion or shortness of breath or feeling clammy on exertion.  Relevant past medical, surgical, family and social history reviewed and updated as indicated. Interim medical history since our last visit reviewed. Allergies and medications reviewed and updated.  Review of Systems  Constitutional:  Negative for chills and fever.  HENT:  Positive for congestion, postnasal drip, rhinorrhea, sinus pressure and sore throat. Negative for ear discharge, ear pain, sneezing and voice change.   Eyes:  Negative for pain, discharge, redness and visual disturbance.  Respiratory:  Negative for cough, shortness of breath and wheezing.   Cardiovascular:  Negative for chest pain and leg swelling.   Musculoskeletal:  Positive for gait problem. Negative for back pain.  Skin:  Negative for rash.  Neurological:  Positive for dizziness and headaches.  All other systems reviewed and are negative.   Per HPI unless specifically indicated above   Allergies as of 02/23/2022   No Known Allergies      Medication List        Accurate as of February 23, 2022  3:24 PM. If you have any questions, ask your nurse or doctor.          STOP taking these medications    zinc gluconate 50 MG tablet Stopped by: Fransisca Kaufmann Tyriq Moragne, MD       TAKE these medications    amLODipine 5 MG tablet Commonly known as: NORVASC Take 1 tablet (5 mg total) by mouth daily.   ascorbic acid 500 MG tablet Commonly known as: VITAMIN C Take 500 mg by mouth daily.   lisinopril 40 MG tablet Commonly known as: ZESTRIL Take 1 tablet (40 mg total) by mouth daily.   multivitamin with minerals Tabs tablet Take 1 tablet by mouth daily.   omeprazole 40 MG capsule Commonly known as: PRILOSEC Take 1 capsule (40 mg total) by mouth daily.   Potassium 99 MG Tabs Take 99 mg by mouth daily.   predniSONE 20 MG tablet Commonly  known as: DELTASONE 2 po at same time daily for 5 days Started by: Worthy Rancher, MD   sildenafil 100 MG tablet Commonly known as: Viagra Take 0.5-1 tablets (50-100 mg total) by mouth daily as needed for erectile dysfunction.   Testosterone 10 MG/ACT (2%) Gel Apply 20 mg topically daily.         Objective:   BP (!) 156/83   Pulse 71   Temp (!) 97.2 F (36.2 C) (Temporal)   Resp 20   Ht '5\' 6"'$  (1.676 m)   Wt 223 lb (101.2 kg)   SpO2 94%   BMI 35.99 kg/m   Wt Readings from Last 3 Encounters:  02/23/22 223 lb (101.2 kg)  12/30/21 217 lb (98.4 kg)  10/20/21 214 lb (97.1 kg)    Physical Exam Vitals and nursing note reviewed.  Constitutional:      General: He is not in acute distress.    Appearance: He is well-developed. He is not diaphoretic.  HENT:     Right  Ear: Tympanic membrane, ear canal and external ear normal.     Left Ear: Tympanic membrane, ear canal and external ear normal.     Nose: Mucosal edema and rhinorrhea present.     Right Sinus: Maxillary sinus tenderness present. No frontal sinus tenderness.     Left Sinus: Maxillary sinus tenderness present. No frontal sinus tenderness.     Mouth/Throat:     Pharynx: Uvula midline. No oropharyngeal exudate or posterior oropharyngeal erythema.     Tonsils: No tonsillar abscesses.  Eyes:     General: No scleral icterus.    Conjunctiva/sclera: Conjunctivae normal.  Neck:     Thyroid: No thyromegaly.  Cardiovascular:     Rate and Rhythm: Normal rate and regular rhythm.     Heart sounds: Normal heart sounds. No murmur heard. Pulmonary:     Effort: Pulmonary effort is normal. No respiratory distress.     Breath sounds: Normal breath sounds. No wheezing or rales.  Musculoskeletal:        General: Normal range of motion.     Cervical back: Neck supple.  Lymphadenopathy:     Cervical: No cervical adenopathy.  Skin:    General: Skin is warm and dry.     Findings: No rash.  Neurological:     Mental Status: He is alert and oriented to person, place, and time.     Coordination: Coordination normal.  Psychiatric:        Behavior: Behavior normal.     EKG: Normal sinus rhythm with heart rate of 61  Assessment & Plan:   Problem List Items Addressed This Visit   None Visit Diagnoses     Chest discomfort    -  Primary   Relevant Orders   EKG 12-Lead (Completed)   Labyrinthitis of left ear       Relevant Medications   predniSONE (DELTASONE) 20 MG tablet       Urine for labyrinthitis versus simple infection, recommended that he try an anti-inflammatory to calm things down and if still progress or procedure note that we may need to further evaluate the dizziness and lightheadedness.  EKG looks good and the chest pains are intermittent so likely spastic or muscular. Follow up  plan: Return if symptoms worsen or fail to improve.  Counseling provided for all of the vaccine components Orders Placed This Encounter  Procedures   EKG 12-Lead    Caryl Pina, MD [facility] 02/23/2022, 3:24 PM

## 2022-05-25 ENCOUNTER — Encounter: Payer: Self-pay | Admitting: Family Medicine

## 2022-05-25 ENCOUNTER — Ambulatory Visit (INDEPENDENT_AMBULATORY_CARE_PROVIDER_SITE_OTHER): Admitting: Family Medicine

## 2022-05-25 VITALS — BP 108/63 | HR 68 | Temp 98.4°F | Ht 66.0 in | Wt 208.0 lb

## 2022-05-25 DIAGNOSIS — M65331 Trigger finger, right middle finger: Secondary | ICD-10-CM

## 2022-05-25 DIAGNOSIS — M7711 Lateral epicondylitis, right elbow: Secondary | ICD-10-CM | POA: Diagnosis not present

## 2022-05-25 DIAGNOSIS — Z79899 Other long term (current) drug therapy: Secondary | ICD-10-CM

## 2022-05-25 DIAGNOSIS — M170 Bilateral primary osteoarthritis of knee: Secondary | ICD-10-CM | POA: Diagnosis not present

## 2022-05-25 DIAGNOSIS — E349 Endocrine disorder, unspecified: Secondary | ICD-10-CM

## 2022-05-25 MED ORDER — TESTOSTERONE 10 MG/ACT (2%) TD GEL: 20.0000 mg | Freq: Every day | TRANSDERMAL | 1 refills | Status: DC

## 2022-05-25 NOTE — Progress Notes (Signed)
BP 108/63   Pulse 68   Temp 98.4 F (36.9 C)   Ht '5\' 6"'$  (1.676 m)   Wt 208 lb (94.3 kg)   SpO2 97%   BMI 33.57 kg/m    Subjective:   Patient ID: Devin Santiago, male    DOB: 1956-03-25, 66 y.o.   MRN: [id number]  HPI: Devin Santiago is a 66 y.o. male presenting on 05/25/2022 for Joint Pain (Bilateral knees, RUE)   HPI Bilateral knee osteoarthritis Patient is bilateral knee osteoarthritis and it does bother him some sometimes give way a little bit with irritation there, it comes and goes and uses some over-the-counter medicine to help with it.  Patient has right lateral elbow pain that hurts with certain movements on the lateral aspect and has been more irritating over the past few months.  He denies doing anything specific to it and it does not hurt all of the time but that is from time.  Patient has one of his fingers on his right hand, the middle finger will click sometimes when he opens and closes it.  It does not necessarily get stuck but it does click sometimes.  Relevant past medical, surgical, family and social history reviewed and updated as indicated. Interim medical history since our last visit reviewed. Allergies and medications reviewed and updated.  Review of Systems  Constitutional:  Negative for chills and fever.  Eyes:  Negative for visual disturbance.  Respiratory:  Negative for shortness of breath and wheezing.   Cardiovascular:  Negative for chest pain and leg swelling.  Musculoskeletal:  Positive for arthralgias and myalgias. Negative for back pain and joint swelling.  Skin:  Negative for rash.  Neurological:  Negative for dizziness, weakness and light-headedness.  All other systems reviewed and are negative.   Per HPI unless specifically indicated above   Allergies as of 05/25/2022   No Known Allergies      Medication List        Accurate as of May 25, 2022 10:38 AM. If you have any questions, ask your nurse or doctor.          STOP  taking these medications    predniSONE 20 MG tablet Commonly known as: DELTASONE Stopped by: Fransisca Kaufmann Kelcee Bjorn, MD       TAKE these medications    amLODipine 5 MG tablet Commonly known as: NORVASC Take 1 tablet (5 mg total) by mouth daily.   ascorbic acid 500 MG tablet Commonly known as: VITAMIN C Take 500 mg by mouth daily.   lisinopril 40 MG tablet Commonly known as: ZESTRIL Take 1 tablet (40 mg total) by mouth daily.   multivitamin with minerals Tabs tablet Take 1 tablet by mouth daily.   omeprazole 40 MG capsule Commonly known as: PRILOSEC Take 1 capsule (40 mg total) by mouth daily.   Potassium 99 MG Tabs Take 99 mg by mouth daily.   sildenafil 100 MG tablet Commonly known as: Viagra Take 0.5-1 tablets (50-100 mg total) by mouth daily as needed for erectile dysfunction.   Testosterone 10 MG/ACT (2%) Gel Apply 20 mg topically daily.         Objective:   BP 108/63   Pulse 68   Temp 98.4 F (36.9 C)   Ht '5\' 6"'$  (1.676 m)   Wt 208 lb (94.3 kg)   SpO2 97%   BMI 33.57 kg/m   Wt Readings from Last 3 Encounters:  05/25/22 208 lb (94.3 kg)  02/23/22 223 lb (101.2 kg)  12/30/21 217 lb (98.4 kg)    Physical Exam Vitals and nursing note reviewed.  Constitutional:      General: He is not in acute distress.    Appearance: He is well-developed. He is not diaphoretic.  Eyes:     General: No scleral icterus.    Conjunctiva/sclera: Conjunctivae normal.  Neck:     Thyroid: No thyromegaly.  Musculoskeletal:        General: Normal range of motion.     Right elbow: No swelling or deformity. Tenderness present in lateral epicondyle.     Right hand: Tenderness (Overlying the palm proximal to the middle finger, no palpable nodule) present.     Cervical back: Neck supple.     Right knee: Crepitus present. No bony tenderness. No tenderness. Normal alignment.     Left knee: Crepitus present. No bony tenderness. No tenderness. Normal alignment.  Lymphadenopathy:      Cervical: No cervical adenopathy.  Skin:    General: Skin is warm and dry.     Findings: No rash.  Neurological:     Mental Status: He is alert and oriented to person, place, and time.     Coordination: Coordination normal.  Psychiatric:        Behavior: Behavior normal.       Assessment & Plan:   Problem List Items Addressed This Visit       Other   Testosterone deficiency   Relevant Medications   Testosterone 10 MG/ACT (2%) GEL   Other Visit Diagnoses     Trigger middle finger of right hand    -  Primary   Right tennis elbow       Bilateral primary osteoarthritis of knee       Controlled substance agreement signed       Relevant Medications   Testosterone 10 MG/ACT (2%) GEL       Recommended conservative management with ice and over-the-counter anti-inflammatories Follow up plan: Return in about 2 months (around 07/25/2022), or if symptoms worsen or fail to improve, for Follow-up chronic medical issues and blood work.  Counseling provided for all of the vaccine components No orders of the defined types were placed in this encounter.   Caryl Pina, MD [facility] 05/25/2022, 10:38 AM

## 2022-07-30 ENCOUNTER — Other Ambulatory Visit: Payer: Self-pay | Admitting: Family Medicine

## 2022-07-30 DIAGNOSIS — E349 Endocrine disorder, unspecified: Secondary | ICD-10-CM

## 2022-07-30 DIAGNOSIS — Z79899 Other long term (current) drug therapy: Secondary | ICD-10-CM

## 2023-11-01 ENCOUNTER — Encounter: Payer: Self-pay | Admitting: Neurology

## 2023-11-01 ENCOUNTER — Ambulatory Visit (INDEPENDENT_AMBULATORY_CARE_PROVIDER_SITE_OTHER): Admitting: Neurology

## 2023-11-01 VITALS — BP 168/84 | HR 88 | Ht 66.0 in | Wt 217.0 lb

## 2023-11-01 DIAGNOSIS — H47013 Ischemic optic neuropathy, bilateral: Secondary | ICD-10-CM | POA: Diagnosis not present

## 2023-11-01 MED ORDER — CLOPIDOGREL BISULFATE 75 MG PO TABS: 75.0000 mg | ORAL_TABLET | Freq: Every day | ORAL | 0 refills | Status: DC

## 2023-11-01 MED ORDER — FAMOTIDINE 40 MG PO TABS: 40.0000 mg | ORAL_TABLET | Freq: Every day | ORAL | 0 refills | Status: DC

## 2023-11-01 NOTE — Patient Instructions (Addendum)
 Start Clopidogrel 75 mg daily for 90 days  Hold the Omeprazole and start Famotidine for 90 days  Continue your other medications  Continue to follow up Dr. Imogene Burn, please obtain all the labs requested by Dr. Imogene Burn  Continue to follow up with PCP  Return as needed

## 2023-11-01 NOTE — Progress Notes (Unsigned)
 [facility]  PATIENT: Devin Santiago DOB: [birth year]  REQUESTING CLINICIAN: Concha Pyo, MD HISTORY FROM: Patient/Chart review  REASON FOR VISIT: Anterior ischemic optic neuropathy    HISTORICAL  CHIEF COMPLAINT:  Chief Complaint  Patient presents with   New Patient (Initial Visit)    Pt in 12, here alone  Pt is referred for IIH. Pt states his last eye exam was 10/31/2023.   HISTORY OF PRESENT ILLNESS:  This is a 68 year old gentleman past medical history of hypertension, hyperlipidemia, GERD, who is presenting for IIH.  Patient tells me that for the past few months he has been dealing with vision problem, he lost the superior aspect of his visual field with his left eye and with the right eye is it the lower inferior visual field.  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  During the workup he did have MRI that show partially empty sella and was recommended neurology for follow-up.  Patient tells me that he does not suffer from a headache, he does not have any headaches and his main issue is his eyes and vision which have been diagnosed as NAION.  In terms of his risk factors, he is on aspirin and does take Crestor 10.  He saw his neuro-ophthalmologist yesterday and he had extensive workup ordered.    OTHER MEDICAL CONDITIONS: Hypertension, Hyperlipidemia, GERD    REVIEW OF SYSTEMS: Full 14 system review of systems performed and negative with exception of: As noted in the HPI   ALLERGIES: No Known Allergies  HOME MEDICATIONS: Outpatient Medications Prior to Visit  Medication Sig Dispense Refill   amLODipine (NORVASC) 10 MG tablet Take 10 mg by mouth daily.     ascorbic acid (VITAMIN C) 500 MG tablet Take 500 mg by mouth daily.     aspirin EC 81 MG tablet Take 81 mg by mouth daily. Swallow whole.     Carboxymethylcellulose Sod PF 0.5 % SOLN Apply to eye.     chlorthalidone (HYGROTON) 25 MG tablet  Take 25 mg by mouth daily.     Cholecalciferol (VITAMIN D3) 50 MCG (2000 UT) CAPS Take by mouth.     ketoconazole (NIZORAL) 2 % cream Apply 1 Application topically daily.     lisinopril (ZESTRIL) 40 MG tablet Take 1 tablet (40 mg total) by mouth daily. 90 tablet 3   Multiple Vitamin (MULTIVITAMIN) capsule Take 1 capsule by mouth daily.     Potassium 99 MG TABS Take 99 mg by mouth daily.     predniSONE (DELTASONE) 20 MG tablet Take by mouth.     pseudoephedrine (SUDAFED) 120 MG 12 hr tablet Take 120 mg by mouth 2 (two) times daily.     rosuvastatin (CRESTOR) 10 MG tablet Take 10 mg by mouth daily.     Semaglutide-Weight Management 0.5 MG/0.5ML SOAJ Inject 0.5 mg into the skin.     Semaglutide-Weight Management 1 MG/0.5ML SOAJ Inject 1 mg into the skin.     sildenafil (VIAGRA) 100 MG tablet Take 0.5-1 tablets (50-100 mg total) by mouth daily as needed for erectile dysfunction. 90 tablet 1   Testosterone 10 MG/ACT (2%) GEL APPLY 20 MG (2 PUMPS) TOPICALLY DAILY. 180 g 1   Zinc 50 MG TABS Take by mouth.     omeprazole (PRILOSEC) 40 MG capsule Take 1 capsule (40 mg total) by mouth daily. 90 capsule 3   amLODipine (NORVASC) 5 MG tablet Take 1 tablet (5 mg total) by mouth daily. 90  tablet 3   Multiple Vitamin (MULTIVITAMIN WITH MINERALS) TABS tablet Take 1 tablet by mouth daily.     No facility-administered medications prior to visit.    PAST MEDICAL HISTORY: Past Medical History:  Diagnosis Date   GERD (gastroesophageal reflux disease)    Hypertension    Insomnia     PAST SURGICAL HISTORY: Past Surgical History:  Procedure Laterality Date   ACHILLES TENDON REPAIR Right 2011   LIPOMA EXCISION Right 11/19/2019   Procedure: EXCISION LIPOMA, 5cm, back, right;  Surgeon: Lucretia Roers, MD;  Location: [facility];  Service: General;  Laterality: Right;    FAMILY HISTORY: Family History  Problem Relation Age of Onset   Stroke Mother    Diabetes Mother    Heart disease Mother     SOCIAL  HISTORY: Social History   Socioeconomic History   Marital status: Married    Spouse name: Not on file   Number of children: Not on file   Years of education: Not on file   Highest education level: Not on file  Occupational History   Not on file  Tobacco Use   Smoking status: Former    Current packs/day: 0.00    Types: Cigarettes    Quit date: 07/17/1998    Years since quitting: 25.3   Smokeless tobacco: Never  Vaping Use   Vaping status: Never Used  Substance and Sexual Activity   Alcohol use: Not Currently    Comment: occ   Drug use: No   Sexual activity: Not on file  Other Topics Concern   Not on file  Social History Narrative   Not on file   Social Drivers of Health   Financial Resource Strain: Not on file  Food Insecurity: Not on file  Transportation Needs: Not on file  Physical Activity: Not on file  Stress: Not on file  Social Connections: Not on file  Intimate Partner Violence: Not on file    PHYSICAL EXAM  GENERAL EXAM/CONSTITUTIONAL: Vitals:  Vitals:   11/01/23 0804 11/01/23 0822  BP: (!) 174/96 (!) 168/84  Pulse: 74 88  Weight: 217 lb (98.4 kg)   Height: 5\' 6"  (1.676 m)    Body mass index is 35.02 kg/m. Wt Readings from Last 3 Encounters:  11/01/23 217 lb (98.4 kg)  05/25/22 208 lb (94.3 kg)  02/23/22 223 lb (101.2 kg)   Patient is in no distress; well developed, nourished and groomed; neck is supple  MUSCULOSKELETAL: Gait, strength, tone, movements noted in Neurologic exam below  NEUROLOGIC: MENTAL STATUS:      No data to display         awake, alert, oriented to person, place and time recent and remote memory intact normal attention and concentration language fluent, comprehension intact, naming intact fund of knowledge appropriate  CRANIAL NERVE:  2nd, 3rd, 4th, 6th -  extraocular muscles intact, no nystagmus With the left eye, there is superior visual field cut  With the right eye, there is inferior visual field cut  5th -  facial sensation symmetric 7th - facial strength symmetric 8th - hearing intact 9th - palate elevates symmetrically, uvula midline 11th - shoulder shrug symmetric 12th - tongue protrusion midline  MOTOR:  normal bulk and tone, full strength in the BUE, BLE  SENSORY:  normal and symmetric to light touch  COORDINATION:  finger-nose-finger, fine finger movements normal  GAIT/STATION:  normal     DIAGNOSTIC DATA (LABS, IMAGING, TESTING) - I reviewed patient records, labs, notes, testing and  imaging myself where available.  Lab Results  Component Value Date   WBC 7.4 12/30/2021   HGB 17.7 12/30/2021   HCT 53.5 (H) 12/30/2021   MCV 87 12/30/2021   PLT 217 12/30/2021      Component Value Date/Time   NA 141 12/30/2021 0828   K 4.4 12/30/2021 0828   CL 105 12/30/2021 0828   CO2 20 12/30/2021 0828   GLUCOSE 130 (H) 12/30/2021 0828   GLUCOSE 137 (H) 11/17/2019 1426   BUN 22 12/30/2021 0828   CREATININE 1.37 (H) 12/30/2021 0828   CALCIUM 9.6 12/30/2021 0828   PROT 7.5 12/30/2021 0828   ALBUMIN 4.6 12/30/2021 0828   AST 37 12/30/2021 0828   ALT 34 12/30/2021 0828   ALKPHOS 65 12/30/2021 0828   BILITOT 0.6 12/30/2021 0828   GFRNONAA 49 (L) 08/19/2020 1132   GFRAA 57 (L) 08/19/2020 1132   Lab Results  Component Value Date   CHOL 129 12/30/2021   HDL 26 (L) 12/30/2021   LDLCALC 57 12/30/2021   TRIG 289 (H) 12/30/2021   CHOLHDL 5.0 12/30/2021   Lab Results  Component Value Date   HGBA1C 5.9 (H) 12/30/2021   No results found for: "VITAMINB12" No results found for: "TSH"  MRI Brain with and without contrast 10/17/2023 1.  Findings suggestive of non-arteritic anterior ischemic optic neuropathy on the left with subtle increased enhancement of the left optic disc and possible increased DWI.  2.  Partially empty sella with widening of the bilateral Meckel's caves without additional findings to suggest idiopathic intracranial hypertension. Consider lumbar puncture if  further clinical assessment is needed.  3.  No evidence of optic neuritis.  4.  No intracranial mass lesion     ASSESSMENT AND PLAN  68 y.o. year old male with hypertension, hyperlipidemia, GERD, recently diagnosed with nonarteritic anterior ischemic optic neuropathy who is presenting for evaluation.  Patient is already diagnosed by neuro-ophthalmologist, I have spent extended amount of time discussing the diagnosis with patient and the proper follow-up.  In terms of his risk factors, he is already on aspirin I will add Plavix for a total of 90 days. In terms of his IIH, he denies any headaches, I think the MRI findings are incidental, and as such no additional workup or medication indicated.  Continue to follow-up with neuro-ophthalmology and return for any other concerns.   1. 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 Imogene Burn, please obtain all the labs requested by Dr. Imogene Burn  Continue to follow up with PCP  Return as needed   No orders of the defined types were placed in this encounter.   Meds ordered this encounter  Medications   clopidogrel (PLAVIX) 75 MG tablet    Sig: Take 1 tablet (75 mg total) by mouth daily.    Dispense:  90 tablet    Refill:  0   famotidine (PEPCID) 40 MG tablet    Sig: Take 1 tablet (40 mg total) by mouth at bedtime.    Dispense:  90 tablet    Refill:  0    Return if symptoms worsen or fail to improve.    Windell Norfolk, MD 11/02/2023, 2:20 PM  [facility] [address], [address] [place], [place] [address] [phone]

## 2024-01-14 ENCOUNTER — Other Ambulatory Visit: Payer: Self-pay | Admitting: Neurology

## 2024-01-30 ENCOUNTER — Telehealth: Payer: Self-pay | Admitting: Neurology

## 2024-01-30 NOTE — Telephone Encounter (Signed)
 Dr Woodford Hayward RN @ [facility] is asking for a call from RN to discuss what Dr Samara Crest would want to do after pt finished the 90 day Rx of the Plavix , when calling [phone] ask for Dr Woodford Hayward RN

## 2024-01-30 NOTE — Telephone Encounter (Signed)
 Discontinue Plavix  after 90 days and continue with Aspirin monotherapy.

## 2024-01-30 NOTE — Telephone Encounter (Signed)
 Returned call to Dr. Woodford Hayward office (nurse Raechel Bulla) and stated, Discontinue Plavix  after 90 days and continue with Aspirin monotherapy. Per Dr. Elspeth Hals request.

## 2024-03-18 ENCOUNTER — Other Ambulatory Visit: Payer: Self-pay | Admitting: Medical Genetics

## 2024-04-10 ENCOUNTER — Other Ambulatory Visit: Payer: Self-pay | Admitting: Family Medicine

## 2024-06-12 ENCOUNTER — Other Ambulatory Visit: Payer: Self-pay | Admitting: Medical Genetics

## 2024-06-12 DIAGNOSIS — Z006 Encounter for examination for normal comparison and control in clinical research program: Secondary | ICD-10-CM
